# Patient Record
Sex: Male | Born: 1990 | Race: Black or African American | Hispanic: No | Marital: Single | Smoking: Former smoker
Health system: Southern US, Community
[De-identification: ages and names within clinical notes are randomized; demographics above are authoritative.]

## PROBLEM LIST (undated history)

## (undated) DIAGNOSIS — L309 Dermatitis, unspecified: Secondary | ICD-10-CM

## (undated) DIAGNOSIS — I10 Essential (primary) hypertension: Secondary | ICD-10-CM

## (undated) DIAGNOSIS — K649 Unspecified hemorrhoids: Secondary | ICD-10-CM

## (undated) HISTORY — DX: Dermatitis, unspecified: L30.9

## (undated) HISTORY — DX: Unspecified hemorrhoids: K64.9

## (undated) HISTORY — DX: Essential (primary) hypertension: I10

## (undated) HISTORY — PX: CYST EXCISION: SHX5701

## (undated) HISTORY — PX: VASECTOMY: SHX75

---
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
## 2009-08-02 ENCOUNTER — Emergency Department (HOSPITAL_COMMUNITY): Admission: EM | Admit: 2009-08-02 | Discharge: 2009-08-02 | Payer: Self-pay | Admitting: Emergency Medicine

## 2010-12-19 NOTE — Op Note (Signed)
NAME:  Andre Pierce, Andre Pierce                        ACCOUNT NO.:  [id number]   MEDICAL RECORD NO.:  [id number]                   PATIENT TYPE:  AMB   LOCATION:  DSC                                  FACILITY:  [facility]   PHYSICIAN:  Cindee Salt, M.D.                    DATE OF BIRTH:  Dec 20, 1990   DATE OF PROCEDURE:  08/02/2003  DATE OF DISCHARGE:                                 OPERATIVE REPORT   PREOPERATIVE DIAGNOSIS:  Volar wrist ganglion, right wrist.   POSTOPERATIVE DIAGNOSIS:  Volar wrist ganglion, right wrist.   OPERATION/PROCEDURE:  Excision volar wrist ganglion, right wrist.   SURGEON:  Cindee Salt, M.D.   Threasa HeadsCarolyne Fiscal.   ANESTHESIA:  General.   HISTORY:  The patient is a 20 year old male with a history of mass over the  radial aspect of his right wrist.  They are desirous of removal, being aware  of wrists and complications.   DESCRIPTION OF PROCEDURE:  The patient is brought to the operating room  where a general anesthetic was carried out without difficulty.  He was  prepped using DuraPrep, supine position, right arm free.  The limb was  exsanguinated with an Esmarch bandage.  Tourniquet high on the arm was  inflated to 250 mmHg.  A curvilinear incision was made over the mass,  carried down through subcutaneous tissue. Bleeders were  electrocauterized.  The radial artery was identified and protected and a large cystic,  multilobulated mass was immediately encountered.  With blunt and sharp  dissection, this was dissected free, protecting the radial artery.  The  specimen was followed down into the radiocarpal joint.  This area was opened  and debrided.  The specimen was sent to pathology.  The wound was irrigated.  The subcutaneous tissue was then closed with interrupted 4-0 Vicryl sutures.  The incision was injected with 0.25% Marcaine without epinephrine, 2 mL  used.  Skin was closed with subcuticular 4-0 Monocryl sutures.  Steri-Strips  are applied.  Sterile  compressive dressings and splint were applied.  The  patient tolerated the procedure well and was taken to the recovery room for  observation in satisfactory condition.   He is discharged home to return to the [facility] in one week.  Tylenol No. 3 and Keflex.                                               Cindee Salt, M.D.    Angelique Blonder  D:  08/02/2003  T:  08/02/2003  Job:  [id number]

## 2011-09-09 ENCOUNTER — Emergency Department (INDEPENDENT_AMBULATORY_CARE_PROVIDER_SITE_OTHER)
Admission: EM | Admit: 2011-09-09 | Discharge: 2011-09-09 | Disposition: A | Discharge status: Home / Self Care | Payer: Self-pay | Source: Home / Self Care | Attending: Emergency Medicine | Admitting: Emergency Medicine

## 2011-09-09 ENCOUNTER — Encounter (HOSPITAL_COMMUNITY): Payer: Self-pay

## 2011-09-09 DIAGNOSIS — K649 Unspecified hemorrhoids: Secondary | ICD-10-CM

## 2011-09-09 DIAGNOSIS — L309 Dermatitis, unspecified: Secondary | ICD-10-CM

## 2011-09-09 DIAGNOSIS — L259 Unspecified contact dermatitis, unspecified cause: Secondary | ICD-10-CM

## 2011-09-09 LAB — OCCULT BLOOD, POC DEVICE: Fecal Occult Bld: NEGATIVE

## 2011-09-09 MED ORDER — TRIAMCINOLONE ACETONIDE 0.1 % EX CREA: TOPICAL_CREAM | Freq: Two times a day (BID) | CUTANEOUS | Status: AC

## 2011-09-09 MED ORDER — HYDROCORTISONE ACETATE 25 MG RE SUPP: 25.0000 mg | Freq: Two times a day (BID) | RECTAL | Status: AC

## 2011-09-09 NOTE — ED Provider Notes (Signed)
History     CSN: [id number]  Arrival date & time 09/09/11  1526   First MD Initiated Contact with Patient 09/09/11 1658      Chief Complaint  Patient presents with  . Rash    (Consider location/radiation/quality/duration/timing/severity/associated sxs/prior treatment) HPI Comments: The patient presents tonight with 2 complaints: Anal pain and eczema.  He has had anal pain for about 2 days. He feels like something is protruding from the anus. He denies any blood in the stool or black stools and has had no diarrhea or constipation. He denies any abdominal pain or prior history of hemorrhoids.  He also has had a two-year history of an eczema-like rash on his arms and legs. He has never seen a doctor for this and has never tried any medication.  Patient is a 21 y.o. male presenting with rash.  Rash     History reviewed. No pertinent past medical history.  History reviewed. No pertinent past surgical history.  History reviewed. No pertinent family history.  History  Substance Use Topics  . Smoking status: Never Smoker   . Smokeless tobacco: Not on file  . Alcohol Use: No      Review of Systems  Constitutional: Negative for fever, chills, appetite change and unexpected weight change.  Respiratory: Negative for cough, shortness of breath and wheezing.   Cardiovascular: Negative for chest pain.  Gastrointestinal: Positive for rectal pain. Negative for nausea, vomiting, abdominal pain, diarrhea, constipation, blood in stool, abdominal distention and anal bleeding.  Genitourinary: Negative for dysuria, urgency and frequency.  Skin: Positive for rash. Negative for color change, pallor and wound.    Allergies  Review of patient's allergies indicates no known allergies.  Home Medications   Current Outpatient Rx  Name Route Sig Dispense Refill  . HYDROCORTISONE ACETATE 25 MG RE SUPP Rectal Place 1 suppository (25 mg total) rectally 2 (two) times daily. 12 suppository 3  .  TRIAMCINOLONE ACETONIDE 0.1 % EX CREA Topical Apply topically 2 (two) times daily. 2268 g 3    BP 110/50  Pulse 89  Temp(Src) 98.3 F (36.8 C) (Oral)  Resp 18  SpO2 98%  Physical Exam  Nursing note and vitals reviewed. Constitutional: He appears well-developed and well-nourished. No distress.  Eyes: No scleral icterus.  Cardiovascular: Normal rate, regular rhythm and normal heart sounds.  Exam reveals no gallop and no friction rub.   No murmur heard. Pulmonary/Chest: Effort normal and breath sounds normal. No respiratory distress. He has no wheezes. He has no rales.  Abdominal: Soft. Bowel sounds are normal. He exhibits no distension and no mass. There is no hepatosplenomegaly. There is no tenderness. There is no rebound, no guarding and no CVA tenderness.  Genitourinary: Guaiac negative stool.       Anoscopic exam a reveals normal external exam. Anoscope was inserted he has one MRI that was not bleeding or inflamed. Digital rectal exam reveals no masses no tenderness, normal prostate, and heme-negative stools.  Skin: Skin is warm and dry. No abrasion, no bruising, no ecchymosis, no lesion and no rash noted. He is not diaphoretic. No erythema. No pallor.       He has an eczema-like rash on his arms and legs with hyperpigmentation and lichenification.    ED Course  Procedures (including critical care time)   Labs Reviewed  OCCULT BLOOD, POC DEVICE   No results found.   1. Hemorrhoids   2. Eczema       MDM  His anal symptoms appear  to be due to a hemorrhoid and I gave him a prescription for Anusol HC. He was interested in having something more permanent done, so I gave him a referral to Dr. Avel Peace. I also discussed anal hygiene with him as well as dietary changes such as more fiber, fluids, and regular exercise.  For his eczema I gave him a prescription for Lantus alone cream and advised him in proper skin care.        Roque Lias, MD 09/09/11  [phone]

## 2011-09-09 NOTE — ED Notes (Signed)
C.o eczema is flaring up again and something on his but is giving him a problem, mostly when he has to go to the restroom ; unable to visualize anything at present

## 2012-03-17 ENCOUNTER — Encounter (HOSPITAL_COMMUNITY): Payer: Self-pay

## 2012-03-17 ENCOUNTER — Emergency Department (HOSPITAL_COMMUNITY)
Admission: EM | Admit: 2012-03-17 | Discharge: 2012-03-17 | Disposition: A | Discharge status: Home / Self Care | Payer: Self-pay | Attending: Emergency Medicine | Admitting: Emergency Medicine

## 2012-03-17 DIAGNOSIS — W268XXA Contact with other sharp object(s), not elsewhere classified, initial encounter: Secondary | ICD-10-CM | POA: Insufficient documentation

## 2012-03-17 DIAGNOSIS — W208XXA Other cause of strike by thrown, projected or falling object, initial encounter: Secondary | ICD-10-CM | POA: Insufficient documentation

## 2012-03-17 DIAGNOSIS — S0180XA Unspecified open wound of other part of head, initial encounter: Secondary | ICD-10-CM | POA: Insufficient documentation

## 2012-03-17 DIAGNOSIS — S41109A Unspecified open wound of unspecified upper arm, initial encounter: Secondary | ICD-10-CM | POA: Insufficient documentation

## 2012-03-17 DIAGNOSIS — S41119A Laceration without foreign body of unspecified upper arm, initial encounter: Secondary | ICD-10-CM

## 2012-03-17 DIAGNOSIS — S0181XA Laceration without foreign body of other part of head, initial encounter: Secondary | ICD-10-CM

## 2012-03-17 MED ORDER — TETANUS-DIPHTH-ACELL PERTUSSIS 5-2.5-18.5 LF-MCG/0.5 IM SUSP
0.5000 mL | Freq: Once | INTRAMUSCULAR | Status: AC
  Administered 2012-03-17: 0.5 mL via INTRAMUSCULAR
  Filled 2012-03-17: qty 0.5

## 2012-03-17 NOTE — ED Provider Notes (Signed)
History  Scribed for Raeford Razor, MD, the patient was seen in room TR10C/TR10C. This chart was scribed by Candelaria Stagers. The patient's care started at 1:15 PM   CSN: [id number]  Arrival date & time 03/17/12  1259   First MD Initiated Contact with Patient 03/17/12 1310      Chief Complaint  Patient presents with  . Laceration     The history is provided by the patient.   Andre Pierce. is a 21 y.o. male who presents to the Emergency Department complaining of a laceration to his right upper arm and left forehead after a light fixture fell onto pt last night.  He reports the metal part of the fixture cut his arm.  He denies LOC, back pain, neck pain, or headache.  Last tetanus shot unknown.  He is experiencing no other sx.   History reviewed. No pertinent past medical history.  History reviewed. No pertinent past surgical history.  No family history on file.  History  Substance Use Topics  . Smoking status: Never Smoker   . Smokeless tobacco: Not on file  . Alcohol Use: No      Review of Systems  Constitutional: Negative for fever.       10 Systems reviewed and are negative for acute change except as noted in the HPI.  HENT: Negative for congestion and neck pain.   Eyes: Negative for discharge and redness.  Respiratory: Negative for cough and shortness of breath.   Cardiovascular: Negative for chest pain.  Gastrointestinal: Negative for vomiting and abdominal pain.  Musculoskeletal: Negative for back pain.  Skin: Positive for wound (laceration to right upper arm, laceration to left forehead). Negative for rash.  Neurological: Negative for syncope, numbness and headaches.  Psychiatric/Behavioral:       No behavior change.    Allergies  Review of patient's allergies indicates no known allergies.  Home Medications   Current Outpatient Rx  Name Route Sig Dispense Refill  . TRIAMCINOLONE ACETONIDE 0.1 % EX CREA Topical Apply topically 2 (two) times daily. 2268  g 3    BP 118/75  Pulse 77  Temp 98.7 F (37.1 C) (Oral)  Resp 16  Ht 5\' 4"  (1.626 m)  Wt 137 lb (62.143 kg)  BMI 23.52 kg/m2  SpO2 98%  Physical Exam  Nursing note and vitals reviewed. Constitutional:       Awake, alert, nontoxic appearance.  HENT:  Head: Atraumatic.  Eyes: Right eye exhibits no discharge. Left eye exhibits no discharge.  Neck: Neck supple.  Pulmonary/Chest: Effort normal. He exhibits no tenderness.  Abdominal: Soft. There is no tenderness. There is no rebound.  Musculoskeletal: He exhibits no tenderness.       .5 cm laceration to the central aspect of the head.  3.5 cm laceration to the posterior upper right arm.  No active bleeding, fat exposed.  Neurovascularly intact.    Neurological:       Mental status and motor strength appears baseline for patient and situation.  Skin: No rash noted.  Psychiatric: He has a normal mood and affect.    ED Course  Procedures   DIAGNOSTIC STUDIES: Oxygen Saturation is 98% on room air, normal by my interpretation.    COORDINATION OF CARE:  1:20PM  LACERATION REPAIR Performed by: Raeford Razor, MD Consent: Verbal consent obtained. Risks and benefits: risks, benefits and alternatives were discussed Patient identity confirmed: provided demographic data Time out performed prior to procedure Prepped and Draped in normal sterile fashion  Wound explored Laceration Location: central aspect of the head Laceration Length: 0.5cm No Foreign Bodies seen or palpated Anesthesia: local infiltration Local anesthetic: lidocaine 2% w epinephrine Anesthetic total: 0.5 cc Irrigation method: syringe Amount of cleaning: standard Skin closure: simple interrupted  Number of sutures or staples: 1 suture w/6-0 prolene  Patient tolerance: Patient tolerated the procedure well with no immediate complications.  LACERATION REPAIR Performed by: Raeford Razor, MD Consent: Verbal consent obtained. Risks and benefits: risks, benefits and  alternatives were discussed Patient identity confirmed: provided demographic data Time out performed prior to procedure Prepped and Draped in normal sterile fashion Wound explored Laceration Location: posterior right upper arm  Laceration Length: 3.5cm No Foreign Bodies seen or palpated Anesthesia: local infiltration Local anesthetic: lidocaine 2% w epinephrine Anesthetic total: 3 cc Irrigation method: syringe Amount of cleaning: standard Skin closure: horizontal mattress sutures Number of sutures or staples: 4 sutures w/4-0 prolene  Technique: Single layer  Patient tolerance: Patient tolerated the procedure well with no immediate complications.  Labs Reviewed - No data to display No results found.   1. Forehead laceration   2. Arm laceration       MDM  21 year old male with lacerations to his forehead and right upper chimney. He should thoroughly irrigated. No evidence of foreign body throughout range of motion or evidence of involvement of deeper structures. Neurovascularly intact. Plan continued wound care. Tetanus is updated. Return precautions were discussed. Outpatient followup for suture removal.  I personally preformed the services scribed in my presence. The recorded information has been reviewed and considered. Raeford Razor, MD.        Raeford Razor, MD 03/25/12 [phone]

## 2012-03-17 NOTE — ED Notes (Signed)
Pt presents with laceration to R upper arm and puncture wound to forehead after light fixture, fell onto pt.  -LOC;  Tetanus status : unknown

## 2012-04-07 ENCOUNTER — Emergency Department (INDEPENDENT_AMBULATORY_CARE_PROVIDER_SITE_OTHER)
Admission: EM | Admit: 2012-04-07 | Discharge: 2012-04-07 | Discharge status: Against Medical Advice | Payer: Self-pay | Source: Home / Self Care | Attending: Emergency Medicine | Admitting: Emergency Medicine

## 2012-04-07 ENCOUNTER — Encounter (HOSPITAL_COMMUNITY): Payer: Self-pay | Admitting: *Deleted

## 2012-04-07 DIAGNOSIS — Z5321 Procedure and treatment not carried out due to patient leaving prior to being seen by health care provider: Secondary | ICD-10-CM

## 2012-04-07 NOTE — ED Notes (Signed)
Suture removal of sutures that have been in place for over a month.

## 2012-04-07 NOTE — ED Notes (Signed)
One suture removed from forehead - no s/s of infection 4 sutures removed from right forearm - area scabbed over, no s/s of infection - cleansed with alcohol

## 2012-04-07 NOTE — ED Provider Notes (Signed)
History     CSN: [id number]  Arrival date & time 04/07/12  1520   First MD Initiated Contact with Patient 04/07/12 1526      Chief Complaint  Patient presents with  . Suture / Staple Removal    (Consider location/radiation/quality/duration/timing/severity/associated sxs/prior treatment) HPI Comments: Patient had one staple on the forehead, 4 4-0 Prolene sutures placed in the posterior right arm on 8/15.   History reviewed. No pertinent past medical history.  History reviewed. No pertinent past surgical history.  Family History  Problem Relation Age of Onset  . Family history unknown: Yes    History  Substance Use Topics  . Smoking status: Current Everyday Smoker -- 0.5 packs/day  . Smokeless tobacco: Not on file  . Alcohol Use: No      Review of Systems  Allergies  Review of patient's allergies indicates no known allergies.  Home Medications   Current Outpatient Rx  Name Route Sig Dispense Refill  . TRIAMCINOLONE ACETONIDE 0.1 % EX CREA Topical Apply topically 2 (two) times daily. 2268 g 3    BP 121/82  Pulse 80  Temp 98.2 F (36.8 C) (Oral)  Resp 18  SpO2 99%  Physical Exam  ED Course  Procedures (including critical care time)  Labs Reviewed - No data to display No results found.   1. Patient left without being seen       MDM  Previous records reviewed. As noted in history of present illness.   RN removed forehead staple, sutures. Patient left before evaluated by MD.  I did not participate in the care of this patient.   Luiz Blare, MD 04/07/12 [phone]

## 2012-06-05 ENCOUNTER — Encounter (HOSPITAL_COMMUNITY): Payer: Self-pay | Admitting: *Deleted

## 2012-06-05 ENCOUNTER — Emergency Department (HOSPITAL_COMMUNITY)
Admission: EM | Admit: 2012-06-05 | Discharge: 2012-06-05 | Disposition: A | Discharge status: Home / Self Care | Payer: Self-pay | Source: Home / Self Care | Attending: Emergency Medicine | Admitting: Emergency Medicine

## 2012-06-05 DIAGNOSIS — A599 Trichomoniasis, unspecified: Secondary | ICD-10-CM

## 2012-06-05 MED ORDER — METRONIDAZOLE 500 MG PO TABS: 500.0000 mg | ORAL_TABLET | Freq: Two times a day (BID) | ORAL | Status: AC

## 2012-06-05 NOTE — ED Provider Notes (Signed)
History     CSN: [id number]  Arrival date & time 06/05/12  1237   First MD Initiated Contact with Patient 06/05/12 1238      Chief Complaint  Patient presents with  . Exposure to STD    (Consider location/radiation/quality/duration/timing/severity/associated sxs/prior treatment) HPI Comments: Patient presents urgent care requesting to be treated for trichomoniasis as his sexual partner was diagnosed with trichomoniasis recently. He denies any symptoms such as penile discharge, dysuria, genital sores or rashes.  Patient is a 21 y.o. male presenting with STD exposure. The history is provided by the patient.  Exposure to STD This is a new problem. Nothing aggravates the symptoms.    History reviewed. No pertinent past medical history.  History reviewed. No pertinent past surgical history.  No family history on file.  History  Substance Use Topics  . Smoking status: Current Every Day Smoker -- 0.5 packs/day  . Smokeless tobacco: Not on file  . Alcohol Use: No      Review of Systems  Constitutional: Negative for activity change and appetite change.  Genitourinary: Negative for dysuria, urgency, frequency, flank pain, decreased urine volume, discharge, penile swelling, genital sores, penile pain and testicular pain.    Allergies  Review of patient's allergies indicates no known allergies.  Home Medications   Current Outpatient Rx  Name  Route  Sig  Dispense  Refill  . METRONIDAZOLE 500 MG PO TABS   Oral   Take 1 tablet (500 mg total) by mouth 2 (two) times daily.   14 tablet   0   . TRIAMCINOLONE ACETONIDE 0.1 % EX CREA   Topical   Apply topically 2 (two) times daily.   2268 g   3     BP 130/86  Pulse 79  Temp 98.8 F (37.1 C) (Oral)  Resp 17  SpO2 100%  Physical Exam  Nursing note and vitals reviewed. Constitutional: Vital signs are normal. He appears well-developed and well-nourished.  Non-toxic appearance. He does not have a sickly appearance. He  does not appear ill. No distress.  Genitourinary: Penis normal. Guaiac negative stool. No penile tenderness.  Skin: No erythema.    ED Course  Procedures (including critical care time)   Labs Reviewed  GC/CHLAMYDIA PROBE AMP, GENITAL   No results found.   1. Trichomonal infection       MDM   Sexual partner diagnosed with trichomoniasis. Patient has been provided with prescription of Flagyl and a DNA urethral probe was obtained for further STD screening as in Chlamydia and gonorrhea. Patient agrees with treatment plan and will be contacted if abnormal test results will require further treatment.       Jimmie Molly, MD 06/05/12 [phone]

## 2012-06-05 NOTE — ED Notes (Signed)
Pt reports  He  Was  Exposed  To  Trich      His   Sexual  Partner   Reportedly  Had  Trich  -  Pt  denys  Any  Symptoms

## 2012-06-06 NOTE — ED Notes (Signed)
Final report pending

## 2012-06-07 LAB — GC/CHLAMYDIA PROBE AMP, GENITAL
Chlamydia, DNA Probe: POSITIVE — AB
GC Probe Amp, Genital: NEGATIVE

## 2012-06-08 ENCOUNTER — Telehealth (HOSPITAL_COMMUNITY): Payer: Self-pay | Admitting: *Deleted

## 2012-06-08 MED ORDER — AZITHROMYCIN 250 MG PO TABS: ORAL_TABLET | ORAL | Status: DC

## 2012-06-08 NOTE — ED Notes (Signed)
Call from pharmacy, asking for clarification on e-Rx for pt ; spoke w Langston Masker, and pt is to receive zithromax 250 mg x4 pills in 1 dose. Spoke directly w pharmacist

## 2012-06-08 NOTE — ED Notes (Signed)
GC neg., Chlamydia pos.  Pt. Needs treatment.  Labs shown to [employer] PA and she e-prescribed Zithromax 1 gm po x 1 dose to the [facility] on [address]. I called pt.  Pt. verified x 2 and given results.  Pt. told he needs the Zithromax, where to get it and to finish the Flagyl. Pt.'s questions answered.  Pt. instructed to notify his partner, no sex for 1 week and to practice safe sex. Pt. told he can get HIV testing at the [facility]. STD clinic, by appointment.  Pt. voiced understanding.  [facility] form completed and faxed to the [facility]. Vassie Moselle 06/08/2012

## 2012-09-24 ENCOUNTER — Encounter (HOSPITAL_COMMUNITY): Payer: Self-pay | Admitting: *Deleted

## 2012-09-24 ENCOUNTER — Emergency Department (INDEPENDENT_AMBULATORY_CARE_PROVIDER_SITE_OTHER)
Admission: EM | Admit: 2012-09-24 | Discharge: 2012-09-24 | Disposition: A | Discharge status: Home / Self Care | Payer: Self-pay | Source: Home / Self Care

## 2012-09-24 ENCOUNTER — Other Ambulatory Visit (HOSPITAL_COMMUNITY)
Admission: RE | Admit: 2012-09-24 | Discharge: 2012-09-24 | Disposition: A | Discharge status: Home / Self Care | Payer: Self-pay | Source: Ambulatory Visit | Attending: Family Medicine | Admitting: Family Medicine

## 2012-09-24 DIAGNOSIS — M7918 Myalgia, other site: Secondary | ICD-10-CM

## 2012-09-24 DIAGNOSIS — M545 Low back pain, unspecified: Secondary | ICD-10-CM

## 2012-09-24 DIAGNOSIS — R319 Hematuria, unspecified: Secondary | ICD-10-CM

## 2012-09-24 DIAGNOSIS — A749 Chlamydial infection, unspecified: Secondary | ICD-10-CM

## 2012-09-24 DIAGNOSIS — M549 Dorsalgia, unspecified: Secondary | ICD-10-CM

## 2012-09-24 DIAGNOSIS — Z113 Encounter for screening for infections with a predominantly sexual mode of transmission: Secondary | ICD-10-CM | POA: Insufficient documentation

## 2012-09-24 DIAGNOSIS — N390 Urinary tract infection, site not specified: Secondary | ICD-10-CM

## 2012-09-24 LAB — POCT URINALYSIS DIP (DEVICE)
Glucose, UA: NEGATIVE mg/dL
Nitrite: NEGATIVE
Protein, ur: 30 mg/dL — AB
Specific Gravity, Urine: 1.02 (ref 1.005–1.030)
Urobilinogen, UA: >=8 mg/dL (ref 0.0–1.0)
pH: 6 (ref 5.0–8.0)

## 2012-09-24 MED ORDER — CEPHALEXIN 500 MG PO CAPS: 500.0000 mg | ORAL_CAPSULE | Freq: Four times a day (QID) | ORAL | Status: DC

## 2012-09-24 MED ORDER — TRAMADOL HCL 50 MG PO TABS: 50.0000 mg | ORAL_TABLET | Freq: Four times a day (QID) | ORAL | Status: DC | PRN

## 2012-09-24 MED ORDER — KETOROLAC TROMETHAMINE 10 MG PO TABS: 10.0000 mg | ORAL_TABLET | Freq: Four times a day (QID) | ORAL | Status: DC | PRN

## 2012-09-24 MED ORDER — AZITHROMYCIN 250 MG PO TABS: 250.0000 mg | ORAL_TABLET | Freq: Once | ORAL | Status: DC

## 2012-09-24 NOTE — ED Provider Notes (Signed)
History     CSN: [id number]  Arrival date & time 09/24/12  1142   First MD Initiated Contact with Patient 09/24/12 1212      Chief Complaint  Patient presents with  . 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 It is located in the right paralumbar musculature and the right posterior costal margin. Palpation of this area creates tenderness. It is worse with movement, bending, stooping, lifting, or rotation of the torso and lying on the right side. The patient was here several days ago and diagnosed with Chlamydia. A prescription was called into the drugstore and the patient notified. He fell to pick up the medication and has not been treated. A urinalysis was obtained which shows hematuria and small amount of WBCs. He states sometimes at night he wakes up sweating and feels hot. Denies cough, shortness of breath, wheezing or orthopnea.    History reviewed. No pertinent past medical history.  History reviewed. No pertinent past surgical history.  No family history on file.  History  Substance Use Topics  . Smoking status: Current Every Day Smoker -- 0.50 packs/day  . Smokeless tobacco: Not on file  . Alcohol Use: No      Review of Systems  Constitutional: Positive for fever, diaphoresis and activity change.  HENT: Negative.   Cardiovascular: Negative.   Gastrointestinal: Positive for nausea. Negative for vomiting and abdominal pain.  Musculoskeletal: Positive for myalgias and back pain.  Skin: Negative.   Neurological: Negative.   Psychiatric/Behavioral: Negative.     Allergies  Review of patient's allergies indicates no known allergies.  Home Medications   Current Outpatient Rx  Name  Route  Sig  Dispense  Refill  . azithromycin (ZITHROMAX Z-PAK) 250 MG tablet      4  tablets po stat.   6 each   0   . cephALEXin (KEFLEX) 500 MG capsule   Oral   Take 1 capsule (500 mg total) by mouth 4 (four) times daily.   28 capsule   0   . ketorolac (TORADOL) 10 MG tablet   Oral   Take 1 tablet (10 mg total) by mouth 4 (four) times daily as needed for pain.   15 tablet   0   . traMADol (ULTRAM) 50 MG tablet   Oral   Take 1 tablet (50 mg total) by mouth every 6 (six) hours as needed for pain.   15 tablet   0     BP 130/80  Pulse 72  Temp(Src) 99.6 F (37.6 C) (Oral)  SpO2 100%  Physical Exam  Constitutional: He is oriented to person, place, and time. He appears well-developed. No distress.  Eyes: Conjunctivae and EOM are normal.  Neck: Neck supple.  Cardiovascular: Normal rate and normal heart sounds.   Pulmonary/Chest: Effort normal and breath sounds normal. No respiratory distress. He has no wheezes. He has no rales.  Abdominal: Soft. He exhibits no distension.  Musculoskeletal: Normal range of motion. He exhibits tenderness. He exhibits no edema.  Tenderness in the right low back as described above. Pain elicited with leaning forward and to the left. Standing and turning of the torso to the left also produces pain.  Lymphadenopathy:    He has no cervical adenopathy.  Neurological: He is alert and oriented to person, place, and time.  Skin: Skin is warm and dry.  Psychiatric: He  has a normal mood and affect.    ED Course  Procedures (including critical care time)  Labs Reviewed  POCT URINALYSIS DIP (DEVICE) - Abnormal; Notable for the following:    Bilirubin Urine MODERATE (*)    Ketones, ur TRACE (*)    Hgb urine dipstick MODERATE (*)    Protein, ur 30 (*)    Leukocytes, UA TRACE (*)    All other components within normal limits  URINE CULTURE  URINE CYTOLOGY ANCILLARY ONLY   No results found.   1. Lumbar muscle pain   2. Back pain   3. Chlamydia infection   4. UTI (lower urinary tract infection)   5. Hematuria       MDM   By the patient's history the right lower back paralumbar musculature is mildly tender but exacerbated by lying on the right side bending over into the left and other movements. He also has a mild elevation in temperature. Turns out he did not take his azithromycin when he was here before to treat his Chlamydia. He also has a urinary tract infection. Hematuria may be coming from the UTI or possibly a ureteral calculus. He is having mild intermittent pain male. We will treat with Toradol 10 mg one every 4 hours as needed for pain. Tramadol 1 tablet every 4 hours when necessary pain #15  Keflex 500 mg 4 times a day for 7 days He must take your azithromycin to treat the Chlamydia. Tylenol every 4 hours for any fever or discomfort The pain in her back it is much worse or severe he may need to go to the emergency department for further workup. Is also imperative that you obtain a primary care doctor for health maintenance and followup on medical issues.         Hayden Rasmussen, NP 09/24/12 1406

## 2012-09-24 NOTE — ED Notes (Signed)
Pt  Reports  Low  Back   Pain  For  sev  Days         denys  Any  specefic  Injury                He  Was  Seen   About  1  Month  Ago  htested  Pos  For  chlymydia    Pt  states  He  Never took his  Azithromycin  That was  Phoned  To  The  Pharmacy

## 2012-09-24 NOTE — ED Provider Notes (Signed)
Medical screening examination/treatment/procedure(s) were performed by resident physician or non-physician practitioner and as supervising physician I was immediately available for consultation/collaboration.   Niaomi Cartaya DOUGLAS MD.   Rachna Schonberger D Olga Bourbeau, MD 09/24/12 1809 

## 2012-09-25 LAB — URINE CULTURE
Colony Count: NO GROWTH
Culture: NO GROWTH
Special Requests: NORMAL

## 2012-09-29 ENCOUNTER — Telehealth (HOSPITAL_COMMUNITY): Payer: Self-pay | Admitting: *Deleted

## 2012-09-29 NOTE — ED Notes (Signed)
GC/ neg., Chlamydia pos., Trich neg.  Pt. adequately treated with Zithromax. I called pt.  Pt. verified x 2 and given results. Pt. told he was adeq. treated.  Pt. instructed to notify his partner, no sex for 1 week and to practice safe sex. Pt. told he can get HIV testing at the [facility]. STD clinic, by appointment.  Pt. voiced understanding. Vassie Moselle 09/29/2012

## 2014-08-22 ENCOUNTER — Ambulatory Visit: Payer: Self-pay

## 2015-03-29 ENCOUNTER — Ambulatory Visit: Payer: Self-pay

## 2017-02-20 ENCOUNTER — Encounter (HOSPITAL_COMMUNITY): Payer: Self-pay | Admitting: Family Medicine

## 2017-02-20 ENCOUNTER — Ambulatory Visit (HOSPITAL_COMMUNITY)
Admission: EM | Admit: 2017-02-20 | Discharge: 2017-02-20 | Disposition: A | Discharge status: Home / Self Care | Payer: Self-pay | Attending: Family Medicine | Admitting: Family Medicine

## 2017-02-20 DIAGNOSIS — B356 Tinea cruris: Secondary | ICD-10-CM

## 2017-02-20 MED ORDER — KETOCONAZOLE 2 % EX CREA: TOPICAL_CREAM | CUTANEOUS | 0 refills | Status: DC

## 2017-02-20 NOTE — Discharge Instructions (Signed)
Clotrimazole 1% cream twice daily is an alternative to the ketoconazole.   Use baby powder to keep things dry after this issue resolves.

## 2017-02-20 NOTE — ED Provider Notes (Signed)
  [facility]    CSN: [id number] Arrival date & time: 02/20/17  1203     History   Chief Complaint Chief Complaint  Patient presents with  . itching in groin area    HPI Antony BlackbirdReginald S Lebleu Jr. is a 26 y.o. male.   HPI  Patient presents with 2 weeks of very itchy rash in his groin region. 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 He is very physically active at his job and has been sweating much more often given the hot weather. He has never had anything like this before. He denies any sick contacts, testicular pain, penile discharge, or new soaps/lotions/topical/detergents.   History reviewed. No pertinent past medical history.   History reviewed. No pertinent surgical history.     Home Medications    Takes no medications routinely.  Family History History reviewed. No pertinent family history.  Social History Social History  Substance Use Topics  . Smoking status: Current Every Day Smoker    Packs/day: 0.50  . Alcohol use No     Allergies   Patient has no known allergies.   Review of Systems Review of Systems  Skin:       +itchy rash in groin     Physical Exam Triage Vital Signs ED Triage Vitals [02/20/17 1224]  Enc Vitals Group     BP 128/85     Pulse Rate 88     Resp 18     Temp 98.3 F (36.8 C)     SpO2 100 %   Updated Vital Signs BP 128/85   Pulse 88   Temp 98.3 F (36.8 C)   Resp 18   SpO2 100%   Physical Exam  Constitutional: He is oriented to person, place, and time. He appears well-developed and well-nourished.  HENT:  Head: Normocephalic and atraumatic.  Genitourinary: Penis normal.  Neurological: He is alert and oriented to person, place, and time.  Skin:  Some scaling and hyperpigmentation/erythema in the groin region affecting scrotum and prox ant thigh, worse on the L, no drainage, fluctuance or TTP  Psychiatric: He has a normal mood and affect. Judgment normal.     UC Treatments / Results    Procedures Procedures none  Initial Impression / Assessment and Plan / UC Course  I have reviewed the triage vital signs and the nursing notes.  Pertinent labs & imaging results that were available during my care of the patient were reviewed by me and considered in my medical decision making (see chart for details).     Patient presents with tinea cruris. Ketoconazole to be called in, clotrimazole to be but over-the-counter if it is too expensive as he does not have insurance. Discussed trying to keep the area as dry as possible to prevent recurrence. Recommended baby powder once this issue is gone. The patient was recommended to get a primary care provider. He is discharged in stable condition. The patient voiced understanding and agreement to the plan.  Final Clinical Impressions(s) / UC Diagnoses   Final diagnoses:  Tinea cruris    New Prescriptions Discharge Medication List as of 02/20/2017 12:42 PM    START taking these medications   Details  ketoconazole (NIZORAL) 2 % cream Apply once daily to area in groin., Normal         Sharlene DoryWendling, Jager Koska Paul, DO 02/20/17 1247

## 2017-02-20 NOTE — ED Triage Notes (Signed)
Pt here with complaints of itching in the groin area. sts that he sweats a lot at work.

## 2020-04-16 ENCOUNTER — Ambulatory Visit (HOSPITAL_COMMUNITY)
Admission: EM | Admit: 2020-04-16 | Discharge: 2020-04-16 | Disposition: A | Discharge status: Home / Self Care | Payer: BC Managed Care – PPO | Attending: Family Medicine | Admitting: Family Medicine

## 2020-04-16 ENCOUNTER — Ambulatory Visit (INDEPENDENT_AMBULATORY_CARE_PROVIDER_SITE_OTHER): Payer: BC Managed Care – PPO

## 2020-04-16 ENCOUNTER — Other Ambulatory Visit: Payer: Self-pay

## 2020-04-16 ENCOUNTER — Encounter (HOSPITAL_COMMUNITY): Payer: Self-pay | Admitting: Emergency Medicine

## 2020-04-16 DIAGNOSIS — S20212A Contusion of left front wall of thorax, initial encounter: Secondary | ICD-10-CM | POA: Diagnosis not present

## 2020-04-16 DIAGNOSIS — R0781 Pleurodynia: Secondary | ICD-10-CM | POA: Diagnosis not present
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
## 2020-04-21 ENCOUNTER — Other Ambulatory Visit: Payer: Self-pay

## 2020-04-21 ENCOUNTER — Encounter (HOSPITAL_COMMUNITY): Payer: Self-pay

## 2020-04-21 ENCOUNTER — Ambulatory Visit (HOSPITAL_COMMUNITY)
Admission: EM | Admit: 2020-04-21 | Discharge: 2020-04-21 | Disposition: A | Discharge status: Home / Self Care | Payer: BC Managed Care – PPO | Attending: Emergency Medicine | Admitting: Emergency Medicine

## 2020-04-21 DIAGNOSIS — S20219D Contusion of unspecified front wall of thorax, subsequent encounter: Secondary | ICD-10-CM | POA: Diagnosis not present

## 2020-04-21 MED ORDER — HYDROCODONE-ACETAMINOPHEN 5-325 MG PO TABS: 1.0000 | ORAL_TABLET | Freq: Four times a day (QID) | ORAL | 0 refills | Status: DC | PRN

## 2020-04-21 MED ORDER — NAPROXEN 500 MG PO TABS: 500.0000 mg | ORAL_TABLET | Freq: Two times a day (BID) | ORAL | 0 refills | Status: DC

## 2020-04-21 MED ORDER — TIZANIDINE HCL 4 MG PO TABS: 4.0000 mg | ORAL_TABLET | Freq: Four times a day (QID) | ORAL | 0 refills | Status: DC | PRN

## 2020-04-21 NOTE — ED Triage Notes (Signed)
Pt is f/u from 04/16/20; states he feels rib pain is getting worse.
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
## 2020-04-21 NOTE — Discharge Instructions (Signed)
Naprosyn twice daily with food to help with swelling and inflammation Supplement with muscle relaxer/tizanidine at home Hydrocodone for severe pain Alternate ice and heat gentle stretching and movement, avoid significant lifting Follow-up if not improving or worsening

## 2020-05-22 DIAGNOSIS — K6289 Other specified diseases of anus and rectum: Secondary | ICD-10-CM | POA: Diagnosis not present

## 2020-07-15 DIAGNOSIS — Z1159 Encounter for screening for other viral diseases: Secondary | ICD-10-CM | POA: Diagnosis not present

## 2020-07-31 DIAGNOSIS — Z1159 Encounter for screening for other viral diseases: Secondary | ICD-10-CM | POA: Diagnosis not present

## 2020-09-02 DIAGNOSIS — Z01812 Encounter for preprocedural laboratory examination: Secondary | ICD-10-CM | POA: Diagnosis not present

## 2020-09-05 ENCOUNTER — Encounter (HOSPITAL_COMMUNITY): Payer: Self-pay

## 2020-09-05 ENCOUNTER — Other Ambulatory Visit: Payer: Self-pay

## 2020-09-05 ENCOUNTER — Ambulatory Visit (HOSPITAL_COMMUNITY)
Admission: EM | Admit: 2020-09-05 | Discharge: 2020-09-05 | Disposition: A | Discharge status: Home / Self Care | Payer: BLUE CROSS/BLUE SHIELD | Attending: Family Medicine | Admitting: Family Medicine

## 2020-09-05 DIAGNOSIS — Z202 Contact with and (suspected) exposure to infections with a predominantly sexual mode of transmission: Secondary | ICD-10-CM

## 2020-09-05 DIAGNOSIS — K6289 Other specified diseases of anus and rectum: Secondary | ICD-10-CM | POA: Diagnosis not present

## 2020-09-05 MED ORDER — METRONIDAZOLE 500 MG PO TABS: 2000.0000 mg | ORAL_TABLET | Freq: Once | ORAL | 0 refills | Status: AC

## 2020-09-05 NOTE — ED Provider Notes (Signed)
[facility]    CSN: [id number] Arrival date & time: 09/05/20  0803      History   Chief Complaint Chief Complaint  Patient presents with  . SEXUALLY TRANSMITTED DISEASE    HPI Andre Pierce. is a 30 y.o. male.   Here today requesting treatment for trichomoniasis as his girlfriend tested positive for this. They have had unprotected intercourse. He denies dysuria, hematuria, penile discharge, pelvic pain, fever.      History reviewed. No pertinent past medical history.  There are no problems to display for this patient.   History reviewed. No pertinent surgical history.     Home Medications    Prior to Admission medications   Medication Sig Start Date End Date Taking? Authorizing Provider  metroNIDAZOLE (FLAGYL) 500 MG tablet Take 4 tablets (2,000 mg total) by mouth once for 1 dose. 09/05/20 09/05/20 Yes Particia Nearing, PA-C  HYDROcodone-acetaminophen (NORCO/VICODIN) 5-325 MG tablet Take 1-2 tablets by mouth every 6 (six) hours as needed. 04/21/20   Wieters, Hallie C, PA-C  ketoconazole (NIZORAL) 2 % cream Apply once daily to area in groin. 02/20/17   Sharlene Dory, DO  naproxen (NAPROSYN) 500 MG tablet Take 1 tablet (500 mg total) by mouth 2 (two) times daily. 04/21/20   Wieters, Hallie C, PA-C  tiZANidine (ZANAFLEX) 4 MG tablet Take 1 tablet (4 mg total) by mouth every 6 (six) hours as needed for muscle spasms. 04/21/20   Wieters, Junius Creamer, PA-C    Family History Family History  Problem Relation Age of Onset  . Healthy Mother   . Healthy Father     Social History Social History   Tobacco Use  . Smoking status: Current Every Day Smoker    Packs/day: 0.50  . Smokeless tobacco: Never Used  Substance Use Topics  . Alcohol use: No  . Drug use: No     Allergies   Patient has no known allergies.   Review of Systems Review of Systems PER HPI   Physical Exam Triage Vital Signs ED Triage Vitals  Enc Vitals Group     BP  09/05/20 0822 139/89     Pulse Rate 09/05/20 0822 93     Resp 09/05/20 0822 16     Temp 09/05/20 0822 98.4 F (36.9 C)     Temp Source 09/05/20 0822 Oral     SpO2 09/05/20 0822 99 %     Weight --      Height --      Head Circumference --      Peak Flow --      Pain Score 09/05/20 0819 0     Pain Loc --      Pain Edu? --      Excl. in GC? --    No data found.  Updated Vital Signs BP 139/89 (BP Location: Right Arm)   Pulse 93   Temp 98.4 F (36.9 C) (Oral)   Resp 16   SpO2 99%   Visual Acuity Right Eye Distance:   Left Eye Distance:   Bilateral Distance:    Right Eye Near:   Left Eye Near:    Bilateral Near:     Physical Exam Vitals and nursing note reviewed.  Constitutional:      Appearance: Normal appearance.  HENT:     Head: Atraumatic.  Eyes:     Extraocular Movements: Extraocular movements intact.     Conjunctiva/sclera: Conjunctivae normal.  Cardiovascular:     Rate and  Rhythm: Normal rate and regular rhythm.  Pulmonary:     Effort: Pulmonary effort is normal.     Breath sounds: Normal breath sounds.  Abdominal:     General: Bowel sounds are normal. There is no distension.     Palpations: Abdomen is soft.     Tenderness: There is no abdominal tenderness. There is no right CVA tenderness, left CVA tenderness or guarding.  Genitourinary:    Comments: GU exam deferred, self swab performed Musculoskeletal:        General: Normal range of motion.     Cervical back: Normal range of motion and neck supple.  Skin:    General: Skin is warm and dry.  Neurological:     General: No focal deficit present.     Mental Status: He is oriented to person, place, and time.  Psychiatric:        Mood and Affect: Mood normal.        Thought Content: Thought content normal.        Judgment: Judgment normal.      UC Treatments / Results  Labs (all labs ordered are listed, but only abnormal results are displayed) Labs Reviewed  CYTOLOGY, (ORAL, ANAL, URETHRAL)  ANCILLARY ONLY    EKG   Radiology No results found.  Procedures Procedures (including critical care time)  Medications Ordered in UC Medications - No data to display  Initial Impression / Assessment and Plan / UC Course  I have reviewed the triage vital signs and the nursing notes.  Pertinent labs & imaging results that were available during my care of the patient were reviewed by me and considered in my medical decision making (see chart for details).     Flagyl sent given known exposure to trichomonas, cytology swab pending. Discussed abstinence 1 week post treatment. Safe sexual practices reviewed.   Final Clinical Impressions(s) / UC Diagnoses   Final diagnoses:  Exposure to trichomonas   Discharge Instructions   None    ED Prescriptions    Medication Sig Dispense Auth. Provider   metroNIDAZOLE (FLAGYL) 500 MG tablet Take 4 tablets (2,000 mg total) by mouth once for 1 dose. 4 tablet Particia Nearing, [place]     PDMP not reviewed this encounter.   Particia Nearing, [place] 09/05/20 1258

## 2020-09-05 NOTE — ED Triage Notes (Signed)
Patient presents to Urgent Care with complaints of that GF was dx with trich x 2 weeks ago. Pt declines testing at this time. He states he is here for treatment.    Denies any symptoms.

## 2020-09-06 LAB — CYTOLOGY, (ORAL, ANAL, URETHRAL) ANCILLARY ONLY
Chlamydia: NEGATIVE
Comment: NEGATIVE
Comment: NEGATIVE
Comment: NORMAL
Neisseria Gonorrhea: NEGATIVE
Trichomonas: NEGATIVE

## 2020-09-23 ENCOUNTER — Other Ambulatory Visit: Payer: Self-pay | Admitting: Gastroenterology

## 2020-09-23 DIAGNOSIS — K6289 Other specified diseases of anus and rectum: Secondary | ICD-10-CM

## 2020-09-29 ENCOUNTER — Inpatient Hospital Stay: Admission: RE | Admit: 2020-09-29 | Payer: BLUE CROSS/BLUE SHIELD | Source: Ambulatory Visit

## 2020-10-20 ENCOUNTER — Inpatient Hospital Stay: Admission: RE | Admit: 2020-10-20 | Payer: BLUE CROSS/BLUE SHIELD | Source: Ambulatory Visit

## 2020-11-24 ENCOUNTER — Ambulatory Visit
Admission: RE | Admit: 2020-11-24 | Discharge: 2020-11-24 | Disposition: A | Discharge status: Home / Self Care | Payer: BLUE CROSS/BLUE SHIELD | Source: Ambulatory Visit | Attending: Gastroenterology | Admitting: Gastroenterology

## 2020-11-24 ENCOUNTER — Other Ambulatory Visit: Payer: Self-pay | Admitting: Gastroenterology

## 2020-11-24 ENCOUNTER — Other Ambulatory Visit: Payer: Self-pay

## 2020-11-24 DIAGNOSIS — K6289 Other specified diseases of anus and rectum: Secondary | ICD-10-CM | POA: Diagnosis not present

## 2020-11-24 MED ORDER — GADOBENATE DIMEGLUMINE 529 MG/ML IV SOLN
1.0000 mL | Freq: Once | INTRAVENOUS | Status: AC | PRN
  Administered 2020-11-24: 1 mL via INTRAVENOUS

## 2020-11-25 ENCOUNTER — Other Ambulatory Visit: Payer: Self-pay | Admitting: Gastroenterology

## 2020-11-25 DIAGNOSIS — K6289 Other specified diseases of anus and rectum: Secondary | ICD-10-CM

## 2020-12-01 ENCOUNTER — Ambulatory Visit
Admission: RE | Admit: 2020-12-01 | Discharge: 2020-12-01 | Disposition: A | Discharge status: Home / Self Care | Payer: BLUE CROSS/BLUE SHIELD | Source: Ambulatory Visit | Attending: Gastroenterology | Admitting: Gastroenterology

## 2020-12-01 ENCOUNTER — Other Ambulatory Visit: Payer: Self-pay

## 2020-12-01 DIAGNOSIS — K6289 Other specified diseases of anus and rectum: Secondary | ICD-10-CM | POA: Diagnosis not present

## 2020-12-01 MED ORDER — GADOBENATE DIMEGLUMINE 529 MG/ML IV SOLN
12.0000 mL | Freq: Once | INTRAVENOUS | Status: AC | PRN
  Administered 2020-12-01: 12 mL via INTRAVENOUS

## 2021-05-22 DIAGNOSIS — Z23 Encounter for immunization: Secondary | ICD-10-CM | POA: Diagnosis not present

## 2021-09-15 DIAGNOSIS — M25561 Pain in right knee: Secondary | ICD-10-CM | POA: Diagnosis not present

## 2021-09-15 DIAGNOSIS — M25562 Pain in left knee: Secondary | ICD-10-CM | POA: Diagnosis not present

## 2021-10-06 NOTE — Progress Notes (Signed)
? ?New Patient Office Visit ? ?Subjective:  ?Patient ID: Andre Pierce., male    DOB: 1991-04-01  Age: 31 y.o. MRN: [id number] ? ?CC:  ?Chief Complaint  ?Patient presents with  ? Establish Care  ?  Np. Est care. Pt c/o of snoring heavy w/ periods of no breathing during sleep. Pt states pain in both knees x2-3 weeks  ? ? ?HPI ?Andre Pierce. presents for presents for new patient visit to establish care.  Introduced to Publishing rights manager role and practice setting.  All questions answered.  Discussed provider/patient relationship and expectations. ? ?His wife told him that he stopped breathing while he sleeps and he has also been told that he snores loudly for the past several years. Endorses feeling tired after work. Denies headaches.  ? ?Both of his knees have been hurting for the past 2 weeks. He went to urgent care and was given naproxen which helped, but he ran out of the medication. He also has been taking tylenol.  ? ?KNEE PAIN ? ?Duration: weeks ?Involved knee: bilateral ?Mechanism of injury: unknown ?Location:diffuse ?Onset: sudden ?Severity: 5/10  ?Quality: throbbing, sore ?Frequency: intermittent ?Radiation: no ?Aggravating factors: walking  ?Alleviating factors: APAP, NSAIDs, and rest  ?Status: stable ?Treatments attempted: aleve, brace ?Relief with NSAIDs?:  moderate ?Weakness with weight bearing or walking: no ?Sensation of giving way: no ?Locking: no ?Popping: no ?Bruising: no ?Swelling: no ?Redness: no ?Paresthesias/decreased sensation: no ?Fevers: no ? ? ?Past Medical History:  ?Diagnosis Date  ? Eczema   ? Hemorrhoid   ? ? ?Past Surgical History:  ?Procedure Laterality Date  ? CYST EXCISION Right   ? wrist  ? ? ?Family History  ?Problem Relation Age of Onset  ? Healthy Mother   ? Healthy Father   ? ? ?Social History  ? ?Socioeconomic History  ? Marital status: Single  ?  Spouse name: Not on file  ? Number of children: Not on file  ? Years of education: Not on file  ? Highest education level:  Not on file  ?Occupational History  ? Not on file  ?Tobacco Use  ? Smoking status: Former  ?  Packs/day: 0.50  ?  Types: Cigarettes  ?  Quit date: 2018  ?  Years since quitting: 5.1  ? Smokeless tobacco: Never  ?Vaping Use  ? Vaping Use: Never used  ?Substance and Sexual Activity  ? Alcohol use: Yes  ?  Alcohol/week: 14.0 standard drinks  ?  Types: 14 Shots of liquor per week  ? Drug use: No  ? Sexual activity: Yes  ?  Birth control/protection: None  ?Other Topics Concern  ? Not on file  ?Social History Narrative  ? Not on file  ? ?Social Determinants of Health  ? ?Financial Resource Strain: Not on file  ?Food Insecurity: Not on file  ?Transportation Needs: Not on file  ?Physical Activity: Not on file  ?Stress: Not on file  ?Social Connections: Not on file  ?Intimate Partner Violence: Not on file  ? ? ?ROS ?Review of Systems  ?Constitutional:  Positive for fatigue.  ?HENT: Negative.    ?Eyes: Negative.   ?Respiratory: Negative.    ?Cardiovascular: Negative.   ?Gastrointestinal: Negative.   ?Endocrine: Negative.   ?Genitourinary: Negative.   ?Musculoskeletal:  Positive for arthralgias (bilateral knee pain).  ?Skin: Negative.   ?Neurological: Negative.   ?Psychiatric/Behavioral: Negative.    ? ?Objective:  ? ?Today's Vitals: BP 130/90   Pulse 93   Temp (!) 97.4 ?  F (36.3 ?C) (Temporal)   Ht 5\' 7"  (1.702 m)   Wt 151 lb 12.8 oz (68.9 kg)   SpO2 96%   BMI 23.78 kg/m?  ? ?Physical Exam ?Vitals and nursing note reviewed.  ?Constitutional:   ?   Appearance: Normal appearance.  ?HENT:  ?   Head: Normocephalic and atraumatic.  ?   Right Ear: Tympanic membrane, ear canal and external ear normal.  ?   Left Ear: Tympanic membrane, ear canal and external ear normal.  ?   Nose: Nose normal.  ?   Mouth/Throat:  ?   Mouth: Mucous membranes are moist.  ?   Pharynx: Oropharynx is clear.  ?Eyes:  ?   Conjunctiva/sclera: Conjunctivae normal.  ?Cardiovascular:  ?   Rate and Rhythm: Normal rate and regular rhythm.  ?   Pulses: Normal  pulses.  ?   Heart sounds: Normal heart sounds.  ?Pulmonary:  ?   Effort: Pulmonary effort is normal.  ?   Breath sounds: Normal breath sounds.  ?Abdominal:  ?   General: Bowel sounds are normal.  ?   Palpations: Abdomen is soft.  ?   Tenderness: There is no abdominal tenderness.  ?Musculoskeletal:     ?   General: No swelling or tenderness. Normal range of motion.  ?   Cervical back: Normal range of motion and neck supple. No tenderness.  ?   Comments: Bilateral knees with normal ROM. No tenderness or edema. No laxity or joint unstableness. Negative drawer test bilaterally. Strength 5/5 bilaterally.   ?Lymphadenopathy:  ?   Cervical: No cervical adenopathy.  ?Skin: ?   General: Skin is warm and dry.  ?Neurological:  ?   General: No focal deficit present.  ?   Mental Status: He is alert and oriented to person, place, and time.  ?   Cranial Nerves: No cranial nerve deficit.  ?   Gait: Gait normal.  ?Psychiatric:     ?   Mood and Affect: Mood normal.     ?   Behavior: Behavior normal.     ?   Thought Content: Thought content normal.     ?   Judgment: Judgment normal.  ? ? ?Assessment & Plan:  ? ?Problem List Items Addressed This Visit   ? ?  ? Other  ? Acute pain of both knees  ?  Acute X2 weeks.  Bilateral knee exam WNL.  Naproxen daily was helping with symptoms, will refill.  Stretches given to complete daily.  Wear a brace while at work.  If he is having ongoing symptoms, will order x-rays.  Follow-up in 4 weeks. ?  ?  ? Snoring - Primary  ?  Ongoing snoring and stopping breathing while asleep per his wife.  He endorses some fatigue during the day after work.  He denies headaches.  His diastolic blood pressure is slightly elevated.  Will order home sleep study.  ?  ?  ? Relevant Orders  ? Ambulatory referral to Sleep Studies  ? ? ?Outpatient Encounter Medications as of 10/08/2021  ?Medication Sig  ? naproxen (NAPROSYN) 500 MG tablet Take 1 tablet (500 mg total) by mouth 2 (two) times daily with a meal.  ?  [DISCONTINUED] naproxen (NAPROSYN) 500 MG tablet Take 1 tablet (500 mg total) by mouth 2 (two) times daily.  ? diclofenac Sodium (VOLTAREN) 1 % GEL Apply 1 application. topically 3 (three) times daily as needed.  ? [DISCONTINUED] HYDROcodone-acetaminophen (NORCO/VICODIN) 5-325 MG tablet Take 1-2 tablets by  mouth every 6 (six) hours as needed.  ? [DISCONTINUED] ketoconazole (NIZORAL) 2 % cream Apply once daily to area in groin. (Patient not taking: Reported on 10/08/2021)  ? [DISCONTINUED] tiZANidine (ZANAFLEX) 4 MG tablet Take 1 tablet (4 mg total) by mouth every 6 (six) hours as needed for muscle spasms. (Patient not taking: Reported on 10/08/2021)  ? ?No facility-administered encounter medications on file as of 10/08/2021.  ? ? ?Follow-up: Return in about 4 weeks (around 11/05/2021) for CPE.  ? ?Gerre Scull, NP ? ?

## 2021-10-08 ENCOUNTER — Other Ambulatory Visit: Payer: Self-pay

## 2021-10-08 ENCOUNTER — Ambulatory Visit: Payer: BLUE CROSS/BLUE SHIELD | Admitting: Nurse Practitioner

## 2021-10-08 ENCOUNTER — Encounter: Payer: Self-pay | Admitting: Nurse Practitioner

## 2021-10-08 VITALS — BP 130/90 | HR 93 | Temp 97.4°F | Ht 67.0 in | Wt 151.8 lb

## 2021-10-08 DIAGNOSIS — M25562 Pain in left knee: Secondary | ICD-10-CM | POA: Diagnosis not present

## 2021-10-08 DIAGNOSIS — R0683 Snoring: Secondary | ICD-10-CM

## 2021-10-08 DIAGNOSIS — M25561 Pain in right knee: Secondary | ICD-10-CM

## 2021-10-08 DIAGNOSIS — G8929 Other chronic pain: Secondary | ICD-10-CM | POA: Insufficient documentation

## 2021-10-08 MED ORDER — NAPROXEN 500 MG PO TABS: 500.0000 mg | ORAL_TABLET | Freq: Two times a day (BID) | ORAL | 0 refills | Status: DC

## 2021-10-08 NOTE — Assessment & Plan Note (Signed)
Ongoing snoring and stopping breathing while asleep per his wife.  He endorses some fatigue during the day after work.  He denies headaches.  His diastolic blood pressure is slightly elevated.  Will order home sleep study.  ?

## 2021-10-08 NOTE — Patient Instructions (Signed)
It was great to see you! ? ?Wear a knee brace while you are working. Take naproxen twice a day with food to help with pain. You can also use ice as needed to help with the pain.  ? ?I am going to order a home sleep study.  ? ?Let's follow-up in 4 weeks, sooner if you have concerns. ? ?If a referral was placed today, you will be contacted for an appointment. Please note that routine referrals can sometimes take up to 3-4 weeks to process. Please call our office if you haven't heard anything after this time frame. ? ?Take care, ? ?Rodman Pickle, NP ? ?

## 2021-10-08 NOTE — Assessment & Plan Note (Signed)
Acute X2 weeks.  Bilateral knee exam WNL.  Naproxen daily was helping with symptoms, will refill.  Stretches given to complete daily.  Wear a brace while at work.  If he is having ongoing symptoms, will order x-rays.  Follow-up in 4 weeks. ?

## 2021-10-20 ENCOUNTER — Encounter: Payer: Self-pay | Admitting: Nurse Practitioner

## 2021-10-20 ENCOUNTER — Other Ambulatory Visit: Payer: Self-pay

## 2021-10-20 ENCOUNTER — Ambulatory Visit: Payer: BLUE CROSS/BLUE SHIELD | Admitting: Nurse Practitioner

## 2021-10-20 VITALS — BP 110/80 | HR 78 | Temp 97.7°F | Ht 67.0 in | Wt 151.2 lb

## 2021-10-20 DIAGNOSIS — H1033 Unspecified acute conjunctivitis, bilateral: Secondary | ICD-10-CM | POA: Diagnosis not present

## 2021-10-20 MED ORDER — NEOMYCIN-POLYMYXIN-GRAMICIDIN 1.75-10000-.025 OP SOLN: 1.0000 [drp] | Freq: Every day | OPHTHALMIC | 0 refills | Status: AC

## 2021-10-20 NOTE — Progress Notes (Signed)
? ?  Acute Office Visit ? ?Subjective:  ? ? Patient ID: Andre Pierce., male    DOB: Sep 25, 1990, 31 y.o.   MRN: [id number] ? ?Chief Complaint  ?Patient presents with  ? Acute Visit  ?  Pt c/o eye irritation, swelling, and redness x 2-3 days. Pt states his daughter was diagnosed with pink eye on Friday.  ? ?HPI ?Patient is in today for eye irritation. Onset of Friday, reports eye redness, itching, foreign object sensation, and purulent drainage, contact with daughter with similar symptoms, no use of contact lens, no change in vision, no eye injury. ? ?Outpatient Medications Prior to Visit  ?Medication Sig  ? diclofenac Sodium (VOLTAREN) 1 % GEL Apply 1 application. topically 3 (three) times daily as needed.  ? naproxen (NAPROSYN) 500 MG tablet Take 1 tablet (500 mg total) by mouth 2 (two) times daily with a meal.  ? ?No facility-administered medications prior to visit.  ? ?Reviewed past medical and social history.  ?Review of Systems Per HPI ? ?   ?Objective:  ?  ?Physical Exam ?Vitals reviewed.  ?Eyes:  ?   General: No allergic shiner.    ?   Right eye: Discharge present. No foreign body or hordeolum.     ?   Left eye: Discharge present.No foreign body or hordeolum.  ?   Extraocular Movements:  ?   Right eye: Normal extraocular motion.  ?   Left eye: Normal extraocular motion.  ?   Conjunctiva/sclera:  ?   Right eye: Chemosis present. No exudate or hemorrhage. ?   Left eye: Chemosis present. No exudate or hemorrhage. ?Neurological:  ?   Mental Status: He is alert.  ? ?BP 110/80 (BP Location: Left Arm, Patient Position: Sitting, Cuff Size: Large)   Pulse 78   Temp 97.7 ?F (36.5 ?C) (Temporal)   Ht 5\' 7"  (1.702 m)   Wt 151 lb 3.2 oz (68.6 kg)   BMI 23.68 kg/m?  ? ? ?No results found for any visits on 10/20/21. ? ?   ?Assessment & Plan:  ? ?Problem List Items Addressed This Visit   ?None ?Visit Diagnoses   ? ? Acute bacterial conjunctivitis of right eye    -  Primary  ? Relevant Medications  ?  neomycin-polymyxin-gramicidin (NEOSPORIN) 1.75-10000-.025 ophthalmic solution  ? ?  ? ?Meds ordered this encounter  ?Medications  ? neomycin-polymyxin-gramicidin (NEOSPORIN) 1.75-10000-.025 ophthalmic solution  ?  Sig: Place 1 drop into both eyes 5 (five) times daily for 7 days.  ?  Dispense:  10 mL  ?  Refill:  0  ?  Order Specific Question:   Supervising Provider  ?  Answer:   Abelino Derrick ALFRED [[id number]]  ? ?Return if symptoms worsen or fail to improve. ? ?Wilfred Lacy, NP ?

## 2021-10-20 NOTE — Patient Instructions (Signed)
Allergic Conjunctivitis, Adult ?Allergic conjunctivitis is inflammation of the conjunctiva. The conjunctiva is the thin, clear membrane that covers the white part of the eye and the inner surface of the eyelid. In this condition: ?The blood vessels in the conjunctiva become irritated and swell. ?The eyes become red or pink and feel itchy. ?Allergic conjunctivitis cannot be spread from person to person. This condition can develop at any age and may be outgrown. ?What are the causes? ?This condition is caused by allergens. These are things that can cause an allergic reaction in some people but not in other people. Common allergens include: ?Outdoor allergens, such as: ?Pollen, including pollen from grass and weeds. ?Mold spores. ?Car fumes. ?Indoor allergens, such as: ?Dust. ?Smoke. ?Mold spores. ?Proteins in a pet's urine, saliva, or dander. ?What increases the risk? ?You may be more likely to develop this condition if you have a family history of these things: ?Allergies. ?Conditions caused by being exposed to allergens, such as: ?Allergic rhinitis. This is an allergic reaction that affects the nose. ?Bronchial asthma. This condition affects the large airways in the lungs and makes breathing difficult. ?Atopic dermatitis (eczema). This is inflammation of the skin that is long-term (chronic). ?What are the signs or symptoms? ?Symptoms of this condition include eyes that are: ?Itchy. ?Red. ?Watery. ?Puffy. ?Your eyes may also: ?Sting or burn. ?Have clear fluid draining from them. ?Have thick mucus discharge and pain (vernal conjunctivitis). ?How is this diagnosed? ?This condition may be diagnosed by: ?Your medical history. ?A physical exam. ?Tests of the fluid draining from your eyes to rule out other causes. ?Other tests to confirm the diagnosis, including: ?Testing for allergies. The skin may be pricked with a tiny needle. The pricked area is then exposed to small amounts of allergens. ?Testing for other eye  conditions. Tests may include: ?Blood tests. ?Tissue scrapings from your eyelid. The tissue is then checked under a microscope. ?How is this treated? ?This condition may be treated with: ?Cold, wet cloths (cold compresses) to soothe itching and swelling. ?Washing the face to remove allergens. ?Eye drops. These may be prescription or over-the-counter. You may need to try different types to see which one works best for you, such as: ?Eye drops that block the allergic reaction (antihistamine). ?Eye drops that reduce swelling and irritation (anti-inflammatory). ?Steroid eye drops, which may be given if other treatments have not worked (vernal conjunctivitis). ?Oral antihistamine medicines. These are medicines taken by mouth to lessen your allergic reaction. You may need these if eye drops do not help or are difficult to use. ?Follow these instructions at home: ?Eye care ?Apply a clean, cold compress to your eyes for 10-20 minutes, 3-4 times a day. ?Do not touch or rub your eyes. ?Do not wear contact lenses until the inflammation is gone. Wear glasses instead. ?Do not wear eye makeup until the inflammation is gone. ?General instructions ?Avoid known allergens whenever possible. ?Take or apply over-the-counter and prescription medicines only as told by your health care provider. These include any eye drops. ?Drink enough fluid to keep your urine pale yellow. ?Keep all follow-up visits as told by your health care provider. This is important. ?Contact a health care provider if: ?Your symptoms get worse or do not get better with treatment. ?You have mild eye pain. ?You become sensitive to light. ?You have spots or blisters on your eyes. ?You have pus draining from your eyes. ?You have a fever. ?Get help right away if: ?You have redness, swelling, or other symptoms in   only one eye. ?Your vision is blurred or you have other vision changes. ?You have severe eye pain. ?Summary ?Allergic conjunctivitis is inflammation of the  clear membrane that covers the white part of the eye and the inner surface of the eyelid. ?Take or apply over-the-counter and prescription medicines only as told by your health care provider. These include eye drops. ?Do not touch or rub your eyes. ?Contact a health care provider if your symptoms get worse or do not get better with treatment. ?This information is not intended to replace advice given to you by your health care provider. Make sure you discuss any questions you have with your health care provider. ?Document Revised: 06/12/2019 Document Reviewed: 06/12/2019 ?Elsevier Patient Education ? 2022 [employer] ? ?

## 2021-11-11 NOTE — Patient Instructions (Incomplete)
It was great to see you! ? ?We are checking your labs today and will call with the results.  ? ?Let's follow-up in *** ***, sooner if you have concerns. ? ?If a referral was placed today, you will be contacted for an appointment. Please note that routine referrals can sometimes take up to 3-4 weeks to process. Please call our office if you haven't heard anything after this time frame. ? ?Take care, ? ?Rodman Pickle, NP ? ?

## 2021-11-11 NOTE — Progress Notes (Deleted)
? ?There were no vitals taken for this visit.  ? ?Subjective:  ? ? Patient ID: Andre Pierce., male    DOB: [birth year], 31 y.o.   MRN: [id number] ? ?CC: ?No chief complaint on file. ? ?HPI: ?Andre Pierce. is a 31 y.o. male presenting on 11/12/2021 for comprehensive medical examination. Current medical complaints include:{Blank single:19197::"none","***"} ? ?He currently lives with: ?Interim Problems from his last visit: {Blank single:19197::"yes","no"} ? ?Depression Screen done today and results listed below:  ? ?  10/08/2021  ?  4:00 PM  ?Depression screen PHQ 2/9  ?Decreased Interest 3  ?Down, Depressed, Hopeless 0  ?PHQ - 2 Score 3  ?Altered sleeping 1  ?Tired, decreased energy 1  ?Change in appetite 0  ?Feeling bad or failure about yourself  0  ?Trouble concentrating 0  ?Moving slowly or fidgety/restless 0  ?Suicidal thoughts 0  ?PHQ-9 Score 5  ?Difficult doing work/chores Not difficult at all  ? ? ?The patient {has/does not have:19849} a history of falls. I {did/did not:19850} complete a risk assessment for falls. A plan of care for falls {was/was not:19852} documented. ? ? ?Past Medical History:  ?Past Medical History:  ?Diagnosis Date  ? Eczema   ? Hemorrhoid   ? ? ?Surgical History:  ?Past Surgical History:  ?Procedure Laterality Date  ? CYST EXCISION Right   ? wrist  ? ? ?Medications:  ?Current Outpatient Medications on File Prior to Visit  ?Medication Sig  ? diclofenac Sodium (VOLTAREN) 1 % GEL Apply 1 application. topically 3 (three) times daily as needed.  ? naproxen (NAPROSYN) 500 MG tablet Take 1 tablet (500 mg total) by mouth 2 (two) times daily with a meal.  ? ?No current facility-administered medications on file prior to visit.  ? ? ?Allergies:  ?No Known Allergies ? ?Social History:  ?Social History  ? ?Socioeconomic History  ? Marital status: Single  ?  Spouse name: Not on file  ? Number of children: Not on file  ? Years of education: Not on file  ? Highest education level: Not on file   ?Occupational History  ? Not on file  ?Tobacco Use  ? Smoking status: Former  ?  Packs/day: 0.50  ?  Types: Cigarettes  ?  Quit date: 2018  ?  Years since quitting: 5.2  ? Smokeless tobacco: Never  ?Vaping Use  ? Vaping Use: Never used  ?Substance and Sexual Activity  ? Alcohol use: Yes  ?  Alcohol/week: 14.0 standard drinks  ?  Types: 14 Shots of liquor per week  ? Drug use: No  ? Sexual activity: Yes  ?  Birth control/protection: None  ?Other Topics Concern  ? Not on file  ?Social History Narrative  ? Not on file  ? ?Social Determinants of Health  ? ?Financial Resource Strain: Not on file  ?Food Insecurity: Not on file  ?Transportation Needs: Not on file  ?Physical Activity: Not on file  ?Stress: Not on file  ?Social Connections: Not on file  ?Intimate Partner Violence: Not on file  ? ?Social History  ? ?Tobacco Use  ?Smoking Status Former  ? Packs/day: 0.50  ? Types: Cigarettes  ? Quit date: 2018  ? Years since quitting: 5.2  ?Smokeless Tobacco Never  ? ?Social History  ? ?Substance and Sexual Activity  ?Alcohol Use Yes  ? Alcohol/week: 14.0 standard drinks  ? Types: 14 Shots of liquor per week  ? ? ?Family History:  ?Family History  ?Problem Relation Age of  Onset  ? Healthy Mother   ? Healthy Father   ? ? ?Past medical history, surgical history, medications, allergies, family history and social history reviewed with patient today and changes made to appropriate areas of the chart.  ? ?ROS ?All other ROS negative except what is listed above and in the HPI.  ? ?   ?Objective:  ?  ?There were no vitals taken for this visit.  ?Wt Readings from Last 3 Encounters:  ?10/20/21 151 lb 3.2 oz (68.6 kg)  ?10/08/21 151 lb 12.8 oz (68.9 kg)  ?03/17/12 137 lb (62.1 kg)  ?  ?Physical Exam ? ?Results for orders placed or performed during the hospital encounter of 09/05/20  ?Cytology (oral, anal, urethral) ancillary only  ?Result Value Ref Range  ? Neisseria Gonorrhea Negative   ? Chlamydia Negative   ? Trichomonas Negative   ?  Comment Normal Reference Range Trichomonas - Negative   ? Comment Normal Reference Ranger Chlamydia - Negative   ? Comment    ?  Normal Reference Range Neisseria Gonorrhea - Negative  ? ?   ?Assessment & Plan:  ? ?Problem List Items Addressed This Visit   ?None ?  ? ?IMMUNIZATIONS:   ?- Tdap: Tetanus vaccination status reviewed: last tetanus booster within 10 years. ?- Influenza: Postponed to flu season ?- Pneumovax: Not applicable ?- Prevnar: Not applicable ?- HPV: Not applicable ?- Zostavax vaccine: Not applicable ? ?SCREENING: ?- Colonoscopy: Not applicable  ?Discussed with patient purpose of the colonoscopy is to detect colon cancer at curable precancerous or early stages  ? ?- AAA Screening: Not applicable  ?-Hearing Test: Not applicable  ?-Spirometry: Not applicable  ? ?PATIENT COUNSELING:   ? ?Sexuality: Discussed sexually transmitted diseases, partner selection, use of condoms, avoidance of unintended pregnancy  and contraceptive alternatives.  ? ?Advised to avoid cigarette smoking. ? ?I discussed with the patient that most people either abstain from alcohol or drink within safe limits (<=14/week and <=4 drinks/occasion for males, <=7/weeks and <= 3 drinks/occasion for females) and that the risk for alcohol disorders and other health effects rises proportionally with the number of drinks per week and how often a drinker exceeds daily limits. ? ?Discussed cessation/primary prevention of drug use and availability of treatment for abuse.  ? ?Diet: Encouraged to adjust caloric intake to maintain  or achieve ideal body weight, to reduce intake of dietary saturated fat and total fat, to limit sodium intake by avoiding high sodium foods and not adding table salt, and to maintain adequate dietary potassium and calcium preferably from fresh fruits, vegetables, and low-fat dairy products.   ? ?stressed the importance of regular exercise ? ?Injury prevention: Discussed safety belts, safety helmets, smoke detector,  smoking near bedding or upholstery.  ? ?Dental health: Discussed importance of regular tooth brushing, flossing, and dental visits.  ? ?Follow up plan: ?NEXT PREVENTATIVE PHYSICAL DUE IN 1 YEAR. ?No follow-ups on file. ?

## 2021-11-12 ENCOUNTER — Telehealth: Payer: Self-pay | Admitting: Nurse Practitioner

## 2021-11-12 ENCOUNTER — Encounter: Payer: BLUE CROSS/BLUE SHIELD | Admitting: Nurse Practitioner

## 2021-11-12 DIAGNOSIS — Z0000 Encounter for general adult medical examination without abnormal findings: Secondary | ICD-10-CM

## 2021-11-12 DIAGNOSIS — Z1159 Encounter for screening for other viral diseases: Secondary | ICD-10-CM

## 2021-11-12 DIAGNOSIS — Z114 Encounter for screening for human immunodeficiency virus [HIV]: Secondary | ICD-10-CM

## 2021-11-12 DIAGNOSIS — Z13220 Encounter for screening for lipoid disorders: Secondary | ICD-10-CM

## 2021-11-12 NOTE — Telephone Encounter (Signed)
1st no show, fee waived as one time courtesy ?

## 2021-11-12 NOTE — Telephone Encounter (Signed)
Patient/Caregiver was notified of No Show/Late Cancellation Policy & possible $50 charge. ?Visit was cancelled with reason "No Show/Cancel within 24 hours" for tracking & charging. ? ?Caller Name: Andre Pierce ?Caller Ph #: [phone] ?Date of APPT: 11/12/21 ?Reason given for no show/late cancellation: no reason no call  ?No Show Letter printed & put in outgoing mail (Yes/No): yes ? ?~~~Route message to admin supervisor and clinical team/CMA~~~ ? ?  ?

## 2021-12-22 ENCOUNTER — Ambulatory Visit (INDEPENDENT_AMBULATORY_CARE_PROVIDER_SITE_OTHER): Payer: BLUE CROSS/BLUE SHIELD

## 2021-12-22 ENCOUNTER — Ambulatory Visit: Payer: BLUE CROSS/BLUE SHIELD | Admitting: Nurse Practitioner

## 2021-12-22 ENCOUNTER — Encounter: Payer: Self-pay | Admitting: Nurse Practitioner

## 2021-12-22 VITALS — BP 130/78 | HR 104 | Temp 97.1°F | Wt 151.8 lb

## 2021-12-22 DIAGNOSIS — G8929 Other chronic pain: Secondary | ICD-10-CM

## 2021-12-22 DIAGNOSIS — M25562 Pain in left knee: Secondary | ICD-10-CM | POA: Diagnosis not present

## 2021-12-22 DIAGNOSIS — M25561 Pain in right knee: Secondary | ICD-10-CM | POA: Diagnosis not present

## 2021-12-22 DIAGNOSIS — R0683 Snoring: Secondary | ICD-10-CM | POA: Diagnosis not present

## 2021-12-22 MED ORDER — NAPROXEN 500 MG PO TABS: 500.0000 mg | ORAL_TABLET | Freq: Two times a day (BID) | ORAL | 1 refills | Status: AC

## 2021-12-22 NOTE — Assessment & Plan Note (Signed)
Chronic, ongoing.  He states that he has not heard yet from the referral for sleep study.  We will reach out to referral coroner to follow-up on this.

## 2021-12-22 NOTE — Assessment & Plan Note (Signed)
Chronic, ongoing.  He states that he is still having significant pain in his knees while he is working.  He has tried changing his shoes, wearing a knee brace, standing on a mat while working.  The naproxen does help with his pain, however he ran out of this medication.  We will send in a refill for naproxen 500 mg twice daily.  We will also order an x-ray of both knees today.  If he is still having ongoing pain, can consider knee joint injection.

## 2021-12-22 NOTE — Progress Notes (Signed)
Established Patient Office Visit  Subjective   Patient ID: Andre Pierce., male    DOB: 1991-07-06  Age: 31 y.o. MRN: [id number]  Chief Complaint  Patient presents with   Knee Pain    Pt c/o still having pain in both knees, and moments of not breathing w/ snoring while sleeping.     HPI  [name] is here today to follow-up on snoring and bilateral knee pain.   He has been having bilateral knee pain for the last few months while at work.  He changed his shoes to more comfortable, tennis shoes for work. He is still having bilateral knee pain at work. 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 He wears a knee brace at work and stands on a mat. He was taking naproxen twice a day, which do help, however he ran out of this medication. He denies swelling. He had to leave work early today due to the pain.  He is also following up on his snoring and episodes where he stops breathing at night.  He states that he has not heard yet on the sleep study referral.  Past Medical History:  Diagnosis Date   Eczema    Hemorrhoid    Past Surgical History:  Procedure Laterality Date   CYST EXCISION Right    wrist   ROS See pertinent positives and negatives per HPI.   Objective:     BP 130/78 (BP Location: Right Arm, Cuff Size: Normal)   Pulse (!) 104   Temp (!) 97.1 F (36.2 C) (Temporal)   Wt 151 lb 12.8 oz (68.9 kg)   SpO2 96%   BMI 23.78 kg/m     Physical Exam Vitals and nursing note reviewed.  Constitutional:      Appearance: Normal appearance.  HENT:     Head: Normocephalic.  Eyes:     Conjunctiva/sclera: Conjunctivae normal.  Cardiovascular:     Rate and Rhythm: Normal rate and regular rhythm.     Pulses: Normal pulses.     Heart sounds: Normal heart sounds.  Pulmonary:     Effort: Pulmonary effort is normal.     Breath sounds: Normal breath sounds.  Musculoskeletal:        General: No swelling or tenderness. Normal range of motion.     Cervical back:  Normal range of motion.     Comments: Negative anterior/posterior drawer test  Skin:    General: Skin is warm.  Neurological:     General: No focal deficit present.     Mental Status: He is alert and oriented to person, place, and time.  Psychiatric:        Mood and Affect: Mood normal.        Behavior: Behavior normal.        Thought Content: Thought content normal.        Judgment: Judgment normal.     Assessment & Plan:   Problem List Items Addressed This Visit       Other   Chronic pain of both knees - Primary    Chronic, ongoing.  He states that he is still having significant pain in his knees while he is working.  He has tried changing his shoes, wearing a knee brace, standing on a mat while working.  The naproxen does help with his pain, however he ran out of this medication.  We will send in a refill for naproxen 500 mg twice daily.  We will also  order an x-ray of both knees today.  If he is still having ongoing pain, can consider knee joint injection.       Relevant Medications   naproxen (NAPROSYN) 500 MG tablet   Other Relevant Orders   DG KNEE 3 VIEW RIGHT   DG KNEE 3 VIEW LEFT   Snoring    Chronic, ongoing.  He states that he has not heard yet from the referral for sleep study.  We will reach out to referral coroner to follow-up on this.        Return for CPE.    Gerre Scull, NP

## 2021-12-22 NOTE — Patient Instructions (Signed)
It was great to see you!  We are checking x-rays of your knees and will let you know the results.   I am following up on your referral for the sleep study  Let's follow-up in 4 weeks, sooner if you have concerns.  If a referral was placed today, you will be contacted for an appointment. Please note that routine referrals can sometimes take up to 3-4 weeks to process. Please call our office if you haven't heard anything after this time frame.  Take care,  Rodman Pickle, NP

## 2021-12-23 NOTE — Progress Notes (Signed)
Called and informed patient of results and provider instructions. Patient voiced understanding.

## 2022-02-12 ENCOUNTER — Ambulatory Visit: Payer: BLUE CROSS/BLUE SHIELD | Admitting: Neurology

## 2022-02-12 ENCOUNTER — Encounter: Payer: Self-pay | Admitting: Neurology

## 2022-02-12 VITALS — BP 136/85 | HR 99 | Ht 66.0 in | Wt 159.8 lb

## 2022-02-12 DIAGNOSIS — E663 Overweight: Secondary | ICD-10-CM | POA: Diagnosis not present

## 2022-02-12 DIAGNOSIS — G4730 Sleep apnea, unspecified: Secondary | ICD-10-CM | POA: Diagnosis not present

## 2022-02-12 DIAGNOSIS — R0681 Apnea, not elsewhere classified: Secondary | ICD-10-CM

## 2022-02-12 DIAGNOSIS — R0683 Snoring: Secondary | ICD-10-CM

## 2022-02-12 DIAGNOSIS — G4719 Other hypersomnia: Secondary | ICD-10-CM

## 2022-02-12 NOTE — Progress Notes (Signed)
Subjective:    Patient ID: Andre Pierce. is a 31 y.o. male.  HPI    Andre Foley, MD, PhD [facility] [address] [address] [place], [place] [address]  Dear Andre Pierce,   I saw your patient, Andre Pierce, upon your kind request in my sleep clinic today for initial consultation of his sleep disorder, in particular, concern for underlying obstructive sleep apnea.  The patient is accompanied by his wife and 6 mo daughter, Andre Pierce, today.  As you know, Andre Pierce is a 31 year old right-handed gentleman with an underlying medical history of eczema, and knee pain, who reports snoring and excessive daytime somnolence as well as witnessed apneas, per wife's report. I reviewed your office note from 10/08/2021.  His Epworth sleepiness score is 12 out of 24, fatigue severity score is 14 out of 63.  Very occasionally, will he wake up with a sense of gasping.  He denies night to night nocturia or recurrent morning headaches.  He lives with his wife and 4 daughters ages 41, 79, 83 and 6 months.  They have no pets in the household.  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  Bedtime is generally between 11 and midnight and rise time around 5:50 AM.  He does not drink caffeine daily.  He quit smoking in 2018.  He drinks alcohol about 4 times a week up to 2 shots.  He does have a TV on in his bedroom and his wife typically turns it off when she goes to sleep.  His mother is currently undergoing evaluation for sleep apnea as well.  His Past Medical History Is Significant For: Past Medical History:  Diagnosis Date   Eczema    Hemorrhoid     His Past Surgical History Is Significant For: Past Surgical History:  Procedure Laterality Date   CYST EXCISION Right    wrist    His Family History Is Significant For: Family History  Problem Relation Age of Onset   Healthy Mother    Healthy Father    Dementia Maternal Grandmother    Cancer Paternal Grandmother     His Social History  Is Significant For: Social History   Socioeconomic History   Marital status: Single    Spouse name: Not on file   Number of children: Not on file   Years of education: Not on file   Highest education level: Not on file  Occupational History   Not on file  Tobacco Use   Smoking status: Former    Packs/day: 0.50    Types: Cigarettes    Quit date: 2018    Years since quitting: 5.5   Smokeless tobacco: Never  Vaping Use   Vaping Use: Never used  Substance and Sexual Activity   Alcohol use: Yes    Alcohol/week: 14.0 standard drinks of alcohol    Types: 14 Shots of liquor per week    Comment: take 1-2 daily shots   Drug use: No   Sexual activity: Yes    Birth control/protection: None  Other Topics Concern   Not on file  Social History Narrative   Caffeine rare.    Education 10 th   [employer]..     Social Determinants of Health   Financial Resource Strain: Not on file  Food Insecurity: Not on file  Transportation Needs: Not on file  Physical Activity: Not on file  Stress: Not on file  Social Connections: Not on file    His Allergies Are:  No Known Allergies:   His Current Medications Are:  Outpatient Encounter Medications as of 02/12/2022  Medication Sig   diclofenac Sodium (VOLTAREN) 1 % GEL Apply 1 application. topically 3 (three) times daily as needed.   naproxen (NAPROSYN) 500 MG tablet Take 1 tablet (500 mg total) by mouth 2 (two) times daily with a meal.   No facility-administered encounter medications on file as of 02/12/2022.  :   Review of Systems:  Out of a complete 14 point review of systems, all are reviewed and negative with the exception of these symptoms as listed below:  Review of Systems  Neurological:        Snoring, wife has noted apnea.  ESS  12, FSS  14.    Objective:  Neurological Exam  Physical Exam Physical Examination:   Vitals:   02/12/22 1400  BP: 136/85  Pulse: 99    General Examination: The patient is a very pleasant 31  y.o. male in no acute distress. He appears well-developed and well-nourished and well groomed.   HEENT: Normocephalic, atraumatic, pupils are equal, round and reactive to light, extraocular tracking is good without limitation to gaze excursion or nystagmus noted. Hearing is grossly intact. Face is symmetric with normal facial animation. Speech is clear with no dysarthria noted. There is no hypophonia. There is no lip, neck/head, jaw or voice tremor. Neck is supple with full range of passive and active motion. There are no carotid bruits on auscultation. Oropharynx exam reveals: mild mouth dryness, good dental hygiene and moderate airway crowding, due to small airway entry, prominent uvula, tonsillar size of about 1-2+ bilaterally.  Tongue protrudes centrally and palate elevates symmetrically, neck circumference of 15-1/8 inches.  He has a minimal overbite.  Chest: Clear to auscultation without wheezing, rhonchi or crackles noted.  Heart: S1+S2+0, regular and normal without murmurs, rubs or gallops noted.   Abdomen: Soft, non-tender and non-distended.  Extremities: There is no pitting edema in the distal lower extremities bilaterally.   Skin: Warm and dry without trophic changes noted.   Musculoskeletal: exam reveals no obvious joint deformities, tenderness or joint swelling or erythema.   Neurologically:  Mental status: The patient is awake, alert and oriented in all 4 spheres. His immediate and remote memory, attention, language skills and fund of knowledge are appropriate. There is no evidence of aphasia, agnosia, apraxia or anomia. Speech is clear with normal prosody and enunciation. Thought process is linear. Mood is normal and affect is normal.  Cranial nerves II - XII are as described above under HEENT exam.  Motor exam: Normal bulk, strength and tone is noted. There is no tremor, Romberg is negative. Reflexes are 2+ throughout. Fine motor skills and coordination: grossly intact.   Cerebellar testing: No dysmetria or intention tremor. There is no truncal or gait ataxia.  Sensory exam: intact to light touch in the upper and lower extremities.  Gait, station and balance: He stands easily. No veering to one side is noted. No leaning to one side is noted. Posture is age-appropriate and stance is narrow based. Gait shows normal stride length and normal pace. No problems turning are noted.   Assessment and Plan:  In summary, Tahir Blank. is a very pleasant 31 y.o.-year old male with an underlying medical history of eczema, and knee pain, whose history and physical exam concerning for sleep disordered breathing, supporting a current working diagnosis of unspecified sleep apnea, with the main differential diagnoses of obstructive sleep apnea (OSA) versus upper airway  resistance syndrome (UARS) versus central sleep apnea (CSA), or mixed sleep apnea. A laboratory attended sleep study is considered gold standard for evaluation of sleep disordered breathing and is recommended at this time and clinically justified.   I had a long chat with the patient and his wife about my findings and the diagnosis of sleep apnea, particularly OSA, its prognosis and treatment options. We talked about medical/conservative treatments, surgical interventions and non-pharmacological approaches for symptom control. I explained, in particular, the risks and ramifications of untreated moderate to severe OSA, especially with respect to developing cardiovascular disease down the road, including congestive heart failure (CHF), difficult to treat hypertension, cardiac arrhythmias (particularly A-fib), neurovascular complications including TIA, stroke and dementia. Even type 2 diabetes has, in part, been linked to untreated OSA. Symptoms of untreated OSA may include (but may not be limited to) daytime sleepiness, nocturia (i.e. frequent nighttime urination), memory problems, mood irritability and suboptimally  controlled or worsening mood disorder such as depression and/or anxiety, lack of energy, lack of motivation, physical discomfort, as well as recurrent headaches, especially morning or nocturnal headaches. We talked about the importance of maintaining a healthy lifestyle and striving for healthy weight.  In addition, we talked about the importance of striving for and maintaining good sleep hygiene. I recommended the following at this time: sleep study.  I outlined the differences between a laboratory attended sleep study which is considered more comprehensive and accurate over the option of a home sleep test (HST); the latter may lead to underestimation of sleep disordered breathing in some instances and does not help with diagnosing upper airway resistance syndrome and is not accurate enough to diagnose primary central sleep apnea typically. I explained the different sleep test procedures to the patient in detail and also outlined possible surgical and non-surgical treatment options of OSA, including the use of a pressure airway pressure (PAP) device (ie CPAP, AutoPAP/APAP or BiPAP in certain circumstances), a custom-made dental device (aka oral appliance, which would require a referral to a specialist dentist or orthodontist typically, and is generally speaking not considered a good choice for patients with full dentures or edentulous state), upper airway surgical options, such as traditional UPPP (which is not considered a first-line treatment) or the Inspire device (hypoglossal nerve stimulator, which would involve a referral for consultation with an ENT surgeon, after careful selection, following inclusion criteria). I explained the PAP treatment option to the patient in detail, as this is generally considered first-line treatment.  The patient indicated that he would be willing to try PAP therapy, if the need arises. I explained the importance of being compliant with PAP treatment, not only for insurance  purposes but primarily to improve patient's symptoms symptoms, and for the patient's long term health benefit, including to reduce His cardiovascular risks longer-term.    We will pick up our discussion about the next steps and treatment options after testing.  We will keep them posted as to the test results by phone call and/or MyChart messaging where possible.  We will plan to follow-up in sleep clinic accordingly as well.  I answered all their questions today and the patient and his wife were in agreement.   I encouraged him to call with any interim questions, concerns, problems or updates or email [place] through MyChart.  Generally speaking, sleep test authorizations may take up to 2 weeks, sometimes less, sometimes longer, the patient is encouraged to get in touch with [place] if they do not hear back from the sleep lab  staff directly within the next 2 weeks.  Thank you very much for allowing me to participate in the care of this nice patient. If I can be of any further assistance to you please do not hesitate to call me at [phone].  Sincerely,   Andre Foley, MD, PhD

## 2022-02-12 NOTE — Patient Instructions (Signed)

## 2022-02-24 ENCOUNTER — Telehealth: Payer: Self-pay | Admitting: Neurology

## 2022-02-24 NOTE — Telephone Encounter (Signed)
HST- [employer] Berkley Harvey: [id number] (exp. 02/18/22 to 04/18/22). Patient is scheduled at [facility] for 03/17/22 at 1:30 pm.  I mailed packet to the patient.

## 2022-03-17 ENCOUNTER — Ambulatory Visit: Payer: BLUE CROSS/BLUE SHIELD | Admitting: Neurology

## 2022-03-17 DIAGNOSIS — R0683 Snoring: Secondary | ICD-10-CM

## 2022-03-17 DIAGNOSIS — E663 Overweight: Secondary | ICD-10-CM

## 2022-03-17 DIAGNOSIS — R0681 Apnea, not elsewhere classified: Secondary | ICD-10-CM

## 2022-03-17 DIAGNOSIS — G4730 Sleep apnea, unspecified: Secondary | ICD-10-CM

## 2022-03-17 DIAGNOSIS — G4734 Idiopathic sleep related nonobstructive alveolar hypoventilation: Secondary | ICD-10-CM

## 2022-03-17 DIAGNOSIS — G4733 Obstructive sleep apnea (adult) (pediatric): Secondary | ICD-10-CM

## 2022-03-17 DIAGNOSIS — G4719 Other hypersomnia: Secondary | ICD-10-CM

## 2022-03-19 NOTE — Progress Notes (Signed)
See procedure note.

## 2022-03-19 NOTE — Addendum Note (Signed)
Addended by: Huston Foley on: 03/19/2022 04:40 PM   Modules accepted: Orders

## 2022-03-19 NOTE — Procedures (Signed)
   [facility]  HOME SLEEP TEST (Watch PAT) REPORT  STUDY DATE: 03/18/2022  DOB: [birth year]  MRN: [id number]  ORDERING CLINICIAN: Huston Foley, MD, PhD   REFERRING CLINICIAN: Gerre Scull, NP   CLINICAL INFORMATION/HISTORY: 31 year old right-handed gentleman with an underlying medical history of eczema, and knee pain, who reports snoring and excessive daytime somnolence as well as witnessed apneas.  Epworth sleepiness score: 12/24.  BMI: 25.5 kg/m  FINDINGS:   Sleep Summary:   Total Recording Time (hours, min): 9 hours, 39 min  Total Sleep Time (hours, min):  8 hours, 18 min  Percent REM (%):    34.8%   Respiratory Indices:   Calculated pAHI (per hour):  32.1/hour         REM pAHI:    39.7/hour       NREM pAHI: 28/hour  Central pAHI: 1/hour  Oxygen Saturation Statistics:    Oxygen Saturation (%) Mean: 90%   Minimum oxygen saturation (%):                 66%   O2 Saturation Range (%): 66-97%    O2 Saturation (minutes) <=88%: 105.1 min  Pulse Rate Statistics:   Pulse Mean (bpm):    99/min    Pulse Range (79-122/min)   IMPRESSION: OSA (obstructive sleep apnea), severe Nocturnal Hypoxemia  RECOMMENDATION:  This home sleep test demonstrates severe obstructive sleep apnea with a total AHI of 32.1/hour and O2 nadir of 66% with significant time below or at 88% saturation of over 100 minutes for the night, indicating nocturnal hypoxemia.  Snoring ranged from mild to louder.  Urgent treatment with positive airway pressure is highly recommended. This will require - ideally - a full night CPAP titration study for proper treatment settings, O2 monitoring and mask fitting. For now, the patient will be advised to proceed with an autoPAP titration/trial at home.  A laboratory attended titration study can be considered in the future for optimization of his treatment and better tolerance of therapy.  Alternative treatment options are limited secondary  to the severity of the patient's sleep disordered breathing. Please note, that untreated obstructive sleep apnea may carry additional perioperative morbidity. Patients with significant obstructive sleep apnea should receive perioperative PAP therapy and the surgeons and particularly the anesthesiologist should be informed of the diagnosis and the severity of the sleep disordered breathing. The patient should be cautioned not to drive, work at heights, or operate dangerous or heavy equipment when tired or sleepy. Review and reiteration of good sleep hygiene measures should be pursued with any patient. Other causes of the patient's symptoms, including circadian rhythm disturbances, an underlying mood disorder, medication effect and/or an underlying medical problem cannot be ruled out based on this test. Clinical correlation is recommended. The patient and his referring provider will be notified of the test results. The patient will be seen in follow up in sleep clinic at [facility].  I certify that I have reviewed the raw data recording prior to the issuance of this report in accordance with the standards of the American Academy of Sleep Medicine (AASM).  INTERPRETING PHYSICIAN:   Huston Foley, MD, PhD  Board Certified in Neurology and Sleep Medicine  [facility] [address] [place], [place] [address] [phone]

## 2022-03-23 ENCOUNTER — Telehealth: Payer: Self-pay | Admitting: *Deleted

## 2022-03-23 ENCOUNTER — Encounter: Payer: Self-pay | Admitting: *Deleted

## 2022-03-23 NOTE — Telephone Encounter (Signed)
LVM for patient to call back to go over sleep study results (03/23/2022)

## 2022-03-23 NOTE — Telephone Encounter (Signed)
Spoke to patient gave sleep study results. Pt chose [facility]  for DME . Informed patient about compliance. Wear AutoPAP 4+hours nightly and keep all f/u appointments so insurance can help pay for AutoPAP machine and supplies Gave patient [facility] number and made initial f/u 05/2022 . Pt expressed understanding and thanked me for calling. Faxed orders to [facility] this evening Per Dr Frances Furbish sent  referring MD sleep study results

## 2022-03-23 NOTE — Telephone Encounter (Signed)
-----   Message from Huston Foley, MD sent at 03/19/2022  4:40 PM EDT ----- Urgent set up requested due to severe desaturations. Patient referred by PCP, seen by me on 02/12/2022, patient had a HST on 03/18/2022.    Please call and notify the patient that the recent home sleep test showed obstructive sleep apnea in the severe range. I recommend treatment for this in the form of autoPAP, which means, that we don't have to bring him in for a sleep study with CPAP, but will let him start using a so called autoPAP machine at home, through a DME company (of his choice, or as per insurance requirement). The DME representative will fit the patient with a mask of choice, educate him on how to use the machine, how to put the mask on, etc. I have placed an order in the chart. Please send the order to a local DME, talk to patient, send report to referring MD. Please also reinforce the need for compliance with treatment. We will need a FU in sleep clinic for 10 weeks post-PAP set up, please arrange that with me or one of our NPs. Thanks,   Huston Foley, MD, PhD [facility])

## 2022-03-23 NOTE — Telephone Encounter (Signed)
Error

## 2022-03-30 DIAGNOSIS — G4733 Obstructive sleep apnea (adult) (pediatric): Secondary | ICD-10-CM | POA: Diagnosis not present

## 2022-04-30 DIAGNOSIS — G4733 Obstructive sleep apnea (adult) (pediatric): Secondary | ICD-10-CM | POA: Diagnosis not present

## 2022-05-26 ENCOUNTER — Encounter: Payer: Self-pay | Admitting: Adult Health

## 2022-05-26 ENCOUNTER — Ambulatory Visit: Payer: BLUE CROSS/BLUE SHIELD | Admitting: Adult Health

## 2022-05-26 VITALS — BP 139/89 | HR 94 | Ht 66.0 in | Wt 152.4 lb

## 2022-05-26 DIAGNOSIS — G4733 Obstructive sleep apnea (adult) (pediatric): Secondary | ICD-10-CM

## 2022-05-26 DIAGNOSIS — G4734 Idiopathic sleep related nonobstructive alveolar hypoventilation: Secondary | ICD-10-CM

## 2022-05-26 NOTE — Patient Instructions (Addendum)
Hightly recommend nightly use of your CPAP for severe sleep apnea, it is important that you are using at least greater than 4 hours per night for optimal benefit and per insurance requirements  You will be contacted to undergo overnight oximetry testing to ensure your oxygen levels are stable with use of CPAP    Follow up in 5 months or call earlier if needed

## 2022-05-26 NOTE — Progress Notes (Signed)
[facility] [address] [place]. [place] [address] [phone]       OFFICE FOLLOW UP NOTE  [name]. Date of Birth:  22-Jun-1991 Medical Record Number:  [id number]   Reason for visit: Initial CPAP follow-up    SUBJECTIVE:   CHIEF COMPLAINT:  Chief Complaint  Patient presents with   Obstructive Sleep Apnea    Pt reports he has to get used to it. He states he tried when he sleeps but then he eventually takes it off. Room 2 alone    HPI:   Update 05/26/2022 JM: Patient is being seen for initial CPAP compliance visit.  Completed HST 8/15 which showed severe OSA with total AHI of 32.1/h and O2 nadir of 66% with significant time below are at 88% saturation of over 100 minutes indicating nocturnal hypoxemia.  Urgent treatment with CPAP highly recommended.  CPAP initiated 8/28.  Review of compliance report over the past 58 days shows 42 out of 58 usage days for 72% compliance and only 1 day greater than 4 hours for 2% compliance.  Average usage 45 minutes.  Residual AHI 0.9 on AutoPap setting 7-14 with EPR level 3.  Pressure in the 95th percentile 7.4.  Leaks in the 95th percentile 3.9.  He reports some difficulty with his CPAP, at times will remove his mask while sleeping or other times will take off as he has difficulty sleeping with it on. At does work 8am-5pm, he lives with his 59 young children and wife, at times will fall asleep before placing mask on or will place mask on prior to going to bed but will need to get back up to care for his children and not place mask back on.  He feels like he tolerates full facemask well. One night, he did use for greater than 4 hours and felt a huge improvement the next day in regards to sleep quality and energy levels.  Epworth Sleepiness Scale 7/24 (prior to CPAP 12/24)    Consult visit 02/12/2022 Dr. Rexene Pierce: [name] is a 31 year old right-handed gentleman with an underlying medical history of eczema, and knee  pain, who reports snoring and excessive daytime somnolence as well as witnessed apneas, per wife's report. I reviewed your office note from 10/08/2021.  His Epworth sleepiness score is 12 out of 24, fatigue severity score is 14 out of 63.  Very occasionally, will he wake up with a sense of gasping.  He denies night to night nocturia or recurrent morning headaches.  He lives with his wife and 4 daughters ages 65, 31, 78 and 56 months.  They have no pets in the household.  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  Bedtime is generally between 11 and midnight and rise time around 5:50 AM.  He does not drink caffeine daily.  He quit smoking in 2018.  He drinks alcohol about 4 times a week up to 2 shots.  He does have a TV on in his bedroom and his wife typically turns it off when she goes to sleep.  His mother is currently undergoing evaluation for sleep apnea as well.     ROS:   14 system review of systems performed and negative with exception of those listed in HPI  PMH:  Past Medical History:  Diagnosis Date   Eczema    Hemorrhoid     PSH:  Past Surgical History:  Procedure Laterality Date   CYST EXCISION Right    wrist  Social History:  Social History   Socioeconomic History   Marital status: Single    Spouse name: Not on file   Number of children: Not on file   Years of education: Not on file   Highest education level: Not on file  Occupational History   Not on file  Tobacco Use   Smoking status: Former    Packs/day: 0.50    Types: Cigarettes    Quit date: 2018    Years since quitting: 5.8   Smokeless tobacco: Never  Vaping Use   Vaping Use: Never used  Substance and Sexual Activity   Alcohol use: Yes    Alcohol/week: 14.0 standard drinks of alcohol    Types: 14 Shots of liquor per week    Comment: take 1-2 daily shots   Drug use: No   Sexual activity: Yes    Birth control/protection: None  Other Topics Concern   Not on file  Social History Narrative   Caffeine rare.     Education 10 th   [employer]..     Social Determinants of Health   Financial Resource Strain: Not on file  Food Insecurity: Not on file  Transportation Needs: Not on file  Physical Activity: Not on file  Stress: Not on file  Social Connections: Not on file  Intimate Partner Violence: Not on file    Family History:  Family History  Problem Relation Age of Onset   Healthy Mother    Healthy Father    Dementia Maternal Grandmother    Cancer Paternal Grandmother     Medications:   Current Outpatient Medications on File Prior to Visit  Medication Sig Dispense Refill   diclofenac Sodium (VOLTAREN) 1 % GEL Apply 1 application. topically 3 (three) times daily as needed.     naproxen (NAPROSYN) 500 MG tablet Take 1 tablet (500 mg total) by mouth 2 (two) times daily with a meal. 180 tablet 1   No current facility-administered medications on file prior to visit.    Allergies:  No Known Allergies    OBJECTIVE:  Physical Exam  Vitals:   05/26/22 1522  BP: 139/89  Pulse: 94  Weight: 152 lb 6 oz (69.1 kg)  Height: 5\' 6"  (1.676 m)   Body mass index is 24.59 kg/m. No results found.   General: well developed, well nourished, very pleasant young African-American male, seated, in no evident distress Head: head normocephalic and atraumatic.   Neck: supple with no carotid or supraclavicular bruits Cardiovascular: regular rate and rhythm, no murmurs Musculoskeletal: no deformity Skin:  no rash/petichiae Vascular:  Normal pulses all extremities   Neurologic Exam Mental Status: Awake and fully alert. Oriented to place and time. Recent and remote memory intact. Attention span, concentration and fund of knowledge appropriate. Mood and affect appropriate.  Cranial Nerves: Pupils equal, briskly reactive to light. Extraocular movements full without nystagmus. Visual fields full to confrontation. Hearing intact. Facial sensation intact. Face, tongue, palate moves normally and  symmetrically.  Motor: Normal bulk and tone. Normal strength in all tested extremity muscles Sensory.: intact to touch , pinprick , position and vibratory sensation.  Coordination: Rapid alternating movements normal in all extremities. Finger-to-nose and heel-to-shin performed accurately bilaterally. Gait and Station: Arises from chair without difficulty. Stance is normal. Gait demonstrates normal stride length and balance without use of AD.  Reflexes: 1+ and symmetric. Toes downgoing.         ASSESSMENT/PLAN: Jesper Stirewalt. is a 31 y.o. year old male  OSA on CPAP : Compliance report shows low usage although optimal residual AHI when using.  Discussed ways to help improve tolerance and desensitization.  Discussed importance of nightly usage with ensuring greater than 4 hours per night for optimal benefit and for insurance requirements. Will place order for ONO testing due to evidence of nocturnal hypoxemia on HST testing, did advise importance of using his CPAP machine during this testing for the entire duration of sleeping to ensure accurate results.  He verbalized understanding.  Continue to follow with DME company for any needed supplies or CPAP related concerns     Follow up in 5 months or call earlier if needed   CC:  PCP: McElwee, Lauren A, NP    I spent 26 minutes of face-to-face and non-face-to-face time with patient.  This included previsit chart review, lab review, study review, order entry, electronic health record documentation, patient education regarding diagnosis of sleep apnea with review and discussion of compliance report and answered all other questions to patient's satisfaction  Frann Rider, [facility]  [facility] [address] [place], [place] [address]  Phone [phone] Fax [phone] Note: This document was prepared with digital dictation and possible smart phrase technology. Any transcriptional errors that  result from this process are unintentional.

## 2022-05-27 NOTE — Progress Notes (Signed)
CPAP Orders has been faxed to DME on file [facility] 05/27/22.

## 2022-05-30 DIAGNOSIS — G4733 Obstructive sleep apnea (adult) (pediatric): Secondary | ICD-10-CM | POA: Diagnosis not present

## 2022-06-02 DIAGNOSIS — Z23 Encounter for immunization: Secondary | ICD-10-CM | POA: Diagnosis not present

## 2022-07-01 ENCOUNTER — Telehealth: Payer: Self-pay

## 2022-07-01 NOTE — Telephone Encounter (Signed)
Sleep lab found patient's CPAP machine and supplies in our home sleep test mailbox on 08/29/2021. Reached out to [facility] to find out who's machine belonged to. Tammy confirmed today (07/01/22) that it belongs to this patient. Pt is noncompliant and DME sent him a letter stating he needed to return it to DME. Tammy picked up machine today.

## 2022-07-31 ENCOUNTER — Encounter: Payer: Self-pay | Admitting: Nurse Practitioner

## 2022-07-31 ENCOUNTER — Ambulatory Visit (INDEPENDENT_AMBULATORY_CARE_PROVIDER_SITE_OTHER): Payer: BLUE CROSS/BLUE SHIELD | Admitting: Nurse Practitioner

## 2022-07-31 VITALS — BP 144/100 | HR 102 | Temp 97.8°F | Ht 66.0 in | Wt 156.2 lb

## 2022-07-31 DIAGNOSIS — M25562 Pain in left knee: Secondary | ICD-10-CM

## 2022-07-31 DIAGNOSIS — G4733 Obstructive sleep apnea (adult) (pediatric): Secondary | ICD-10-CM

## 2022-07-31 DIAGNOSIS — M25561 Pain in right knee: Secondary | ICD-10-CM

## 2022-07-31 DIAGNOSIS — Z3009 Encounter for other general counseling and advice on contraception: Secondary | ICD-10-CM | POA: Diagnosis not present

## 2022-07-31 DIAGNOSIS — R030 Elevated blood-pressure reading, without diagnosis of hypertension: Secondary | ICD-10-CM

## 2022-07-31 DIAGNOSIS — G8929 Other chronic pain: Secondary | ICD-10-CM

## 2022-07-31 MED ORDER — DICLOFENAC SODIUM 1 % EX GEL
4.0000 g | Freq: Three times a day (TID) | CUTANEOUS | 2 refills | Status: DC | PRN
Start: 2022-07-31 — End: 2022-12-07

## 2022-07-31 NOTE — Assessment & Plan Note (Signed)
Blood pressure 144/100 today.  Discussed limiting salt in his diet and increasing exercise.  He states that he is a little anxious when he comes to the office.  Encouraged him to check his blood pressure at home at least once a week.  Follow-up in 3 months.

## 2022-07-31 NOTE — Progress Notes (Signed)
Acute Office Visit  Subjective:     Patient ID: Andre Derasmo., male    DOB: [birth year], 31 y.o.   MRN: [id number]  Chief Complaint  Patient presents with   Office Visit    Would like referral for Vasectomy  No other concerns  Refill on Gel    HPI Patient is in today for requesting referral for a vasectomy. He has 4 girls already and he and his wife discussed options. He is interested in referral for this.   His knee pain is well-controlled with the Voltaren gel.  He will also sometimes take naproxen as needed.  He needs a refill on the Voltaren gel today.  ROS See pertinent positives and negatives per HPI.     Objective:    BP (!) 144/100 (BP Location: Right Arm, Cuff Size: Large)   Pulse (!) 102   Temp 97.8 F (36.6 C) (Temporal)   Ht 5\' 6"  (1.676 m)   Wt 156 lb 3.2 oz (70.9 kg)   SpO2 92%   BMI 25.21 kg/m    Physical Exam Vitals and nursing note reviewed.  Constitutional:      Appearance: Normal appearance.  HENT:     Head: Normocephalic.  Eyes:     Conjunctiva/sclera: Conjunctivae normal.  Cardiovascular:     Rate and Rhythm: Normal rate and regular rhythm.     Pulses: Normal pulses.     Heart sounds: Normal heart sounds.  Pulmonary:     Effort: Pulmonary effort is normal.     Breath sounds: Normal breath sounds.  Musculoskeletal:     Cervical back: Normal range of motion.  Skin:    General: Skin is warm.  Neurological:     General: No focal deficit present.     Mental Status: He is alert and oriented to person, place, and time.  Psychiatric:        Mood and Affect: Mood normal.        Behavior: Behavior normal.        Thought Content: Thought content normal.        Judgment: Judgment normal.       Assessment & Plan:   Problem List Items Addressed This Visit       Respiratory   OSA (obstructive sleep apnea)    He was diagnosed with sleep apnea and was given a CPAP to use.  He states that he was trying to get used to it, however was  not using it too frequently and his insurance stopped paying for it.        Other   Chronic pain of both knees - Primary    Chronic, stable.  Continue Voltaren gel 3 times a day as needed, refill sent to the pharmacy.  Can also continue naproxen 500 mg twice a day as needed.      Elevated blood pressure reading    Blood pressure 144/100 today.  Discussed limiting salt in his diet and increasing exercise.  He states that he is a little anxious when he comes to the office.  Encouraged him to check his blood pressure at home at least once a week.  Follow-up in 3 months.      Other Visit Diagnoses     Encounter for vasectomy counseling       After discussing with patient, will place a referral to urology.   Relevant Orders   Ambulatory referral to Urology       Meds ordered this encounter  Medications   diclofenac Sodium (VOLTAREN) 1 % GEL    Sig: Apply 4 g topically 3 (three) times daily as needed.    Dispense:  100 g    Refill:  2    Return in about 3 months (around 10/30/2022) for CPE.  Gerre Scull, NP

## 2022-07-31 NOTE — Assessment & Plan Note (Signed)
He was diagnosed with sleep apnea and was given a CPAP to use.  He states that he was trying to get used to it, however was not using it too frequently and his insurance stopped paying for it.

## 2022-07-31 NOTE — Assessment & Plan Note (Signed)
Chronic, stable.  Continue Voltaren gel 3 times a day as needed, refill sent to the pharmacy.  Can also continue naproxen 500 mg twice a day as needed.

## 2022-07-31 NOTE — Patient Instructions (Signed)
It was great to see you!  Start checking your blood pressure at home, once a week. Even if this is at [facility] or [facility] in one of the machines. Try to limit the amount of salt in your diet.   I have refilled your voltaren gel.   I am placing a referral to urology to discuss vasectomy.   Let's follow-up in 3 months, sooner if you have concerns.  If a referral was placed today, you will be contacted for an appointment. Please note that routine referrals can sometimes take up to 3-4 weeks to process. Please call our office if you haven't heard anything after this time frame.  Take care,  Rodman Pickle, NP

## 2022-08-07 ENCOUNTER — Telehealth: Payer: Self-pay | Admitting: Nurse Practitioner

## 2022-08-07 NOTE — Telephone Encounter (Signed)
I called pt and gave him info.

## 2022-08-07 NOTE — Telephone Encounter (Signed)
Pt has a referral that was placed on 07/31/22 by Lauren with [facility]. This is in [facility] and it's to far for him. He would like a location in [place].

## 2022-08-25 DIAGNOSIS — Z3009 Encounter for other general counseling and advice on contraception: Secondary | ICD-10-CM | POA: Diagnosis not present

## 2022-10-26 ENCOUNTER — Encounter: Payer: Self-pay | Admitting: Adult Health

## 2022-10-26 ENCOUNTER — Ambulatory Visit: Payer: BLUE CROSS/BLUE SHIELD | Admitting: Adult Health

## 2022-10-26 NOTE — Progress Notes (Deleted)
[facility] [address] [place]. [place] [address] [phone]       OFFICE FOLLOW UP NOTE  Mr. Ismar Puzon. Date of Birth:  1990-08-22 Medical Record Number:  [id number]   Reason for visit: Initial CPAP follow-up    SUBJECTIVE:   CHIEF COMPLAINT:  No chief complaint on file.   HPI:   Update 10/26/2022 JM: Patient returns for CPAP compliance visit.          History provided for reference purposes only Update 05/26/2022 JM: Patient is being seen for initial CPAP compliance visit.  Completed HST 8/15 which showed severe OSA with total AHI of 32.1/h and O2 nadir of 66% with significant time below are at 88% saturation of over 100 minutes indicating nocturnal hypoxemia.  Urgent treatment with CPAP highly recommended.  CPAP initiated 8/28.  Review of compliance report over the past 58 days shows 42 out of 58 usage days for 72% compliance and only 1 day greater than 4 hours for 2% compliance.  Average usage 45 minutes.  Residual AHI 0.9 on AutoPap setting 7-14 with EPR level 3.  Pressure in the 95th percentile 7.4.  Leaks in the 95th percentile 3.9.  He reports some difficulty with his CPAP, at times will remove his mask while sleeping or other times will take off as he has difficulty sleeping with it on. At does work 8am-5pm, he lives with his 57 young children and wife, at times will fall asleep before placing mask on or will place mask on prior to going to bed but will need to get back up to care for his children and not place mask back on.  He feels like he tolerates full facemask well. One night, he did use for greater than 4 hours and felt a huge improvement the next day in regards to sleep quality and energy levels.  Epworth Sleepiness Scale 7/24 (prior to CPAP 12/24)    Consult visit 02/12/2022 Dr. Rexene Alberts: Mr. Quant is a 32 year old right-handed gentleman with an underlying medical history of eczema, and knee pain, who reports snoring and  excessive daytime somnolence as well as witnessed apneas, per wife's report. I reviewed your office note from 10/08/2021.  His Epworth sleepiness score is 12 out of 24, fatigue severity score is 14 out of 63.  Very occasionally, will he wake up with a sense of gasping.  He denies night to night nocturia or recurrent morning headaches.  He lives with his wife and 4 daughters ages 81, 18, 71 and 70 months.  They have no pets in the household.  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  Bedtime is generally between 11 and midnight and rise time around 5:50 AM.  He does not drink caffeine daily.  He quit smoking in 2018.  He drinks alcohol about 4 times a week up to 2 shots.  He does have a TV on in his bedroom and his wife typically turns it off when she goes to sleep.  His mother is currently undergoing evaluation for sleep apnea as well.     ROS:   14 system review of systems performed and negative with exception of those listed in HPI  PMH:  Past Medical History:  Diagnosis Date   Eczema    Hemorrhoid     PSH:  Past Surgical History:  Procedure Laterality Date   CYST EXCISION Right    wrist    Social History:  Social History   Socioeconomic History   Marital  status: Single    Spouse name: Not on file   Number of children: Not on file   Years of education: Not on file   Highest education level: Not on file  Occupational History   Not on file  Tobacco Use   Smoking status: Former    Packs/day: .5    Types: Cigarettes    Quit date: 2018    Years since quitting: 6.2   Smokeless tobacco: Never  Vaping Use   Vaping Use: Never used  Substance and Sexual Activity   Alcohol use: Yes    Alcohol/week: 14.0 standard drinks of alcohol    Types: 14 Shots of liquor per week    Comment: take 1-2 daily shots   Drug use: No   Sexual activity: Yes    Birth control/protection: None  Other Topics Concern   Not on file  Social History Narrative   Caffeine rare.    Education 10 th   [employer]..      Social Determinants of Health   Financial Resource Strain: Not on file  Food Insecurity: Not on file  Transportation Needs: Not on file  Physical Activity: Not on file  Stress: Not on file  Social Connections: Not on file  Intimate Partner Violence: Not on file    Family History:  Family History  Problem Relation Age of Onset   Healthy Mother    Healthy Father    Dementia Maternal Grandmother    Cancer Paternal Grandmother     Medications:   Current Outpatient Medications on File Prior to Visit  Medication Sig Dispense Refill   diclofenac Sodium (VOLTAREN) 1 % GEL Apply 4 g topically 3 (three) times daily as needed. 100 g 2   naproxen (NAPROSYN) 500 MG tablet Take 1 tablet (500 mg total) by mouth 2 (two) times daily with a meal. 180 tablet 1   No current facility-administered medications on file prior to visit.    Allergies:  No Known Allergies    OBJECTIVE:  Physical Exam  There were no vitals filed for this visit.  There is no height or weight on file to calculate BMI. No results found.   General: well developed, well nourished, very pleasant young African-American male, seated, in no evident distress Head: head normocephalic and atraumatic.   Neck: supple with no carotid or supraclavicular bruits Cardiovascular: regular rate and rhythm, no murmurs Musculoskeletal: no deformity Skin:  no rash/petichiae Vascular:  Normal pulses all extremities   Neurologic Exam Mental Status: Awake and fully alert. Oriented to place and time. Recent and remote memory intact. Attention span, concentration and fund of knowledge appropriate. Mood and affect appropriate.  Cranial Nerves: Pupils equal, briskly reactive to light. Extraocular movements full without nystagmus. Visual fields full to confrontation. Hearing intact. Facial sensation intact. Face, tongue, palate moves normally and symmetrically.  Motor: Normal bulk and tone. Normal strength in all tested extremity  muscles Sensory.: intact to touch , pinprick , position and vibratory sensation.  Coordination: Rapid alternating movements normal in all extremities. Finger-to-nose and heel-to-shin performed accurately bilaterally. Gait and Station: Arises from chair without difficulty. Stance is normal. Gait demonstrates normal stride length and balance without use of AD.  Reflexes: 1+ and symmetric. Toes downgoing.         ASSESSMENT/PLAN: Sky Kirshenbaum. is a 32 y.o. year old male     OSA on CPAP : Compliance report shows low usage although optimal residual AHI when using.  Discussed ways to help improve tolerance  and desensitization.  Discussed importance of nightly usage with ensuring greater than 4 hours per night for optimal benefit and for insurance requirements. Will place order for ONO testing due to evidence of nocturnal hypoxemia on HST testing, did advise importance of using his CPAP machine during this testing for the entire duration of sleeping to ensure accurate results.  He verbalized understanding.  Continue to follow with DME company for any needed supplies or CPAP related concerns     Follow up in 5 months or call earlier if needed   CC:  PCP: McElwee, Lauren A, NP    I spent 26 minutes of face-to-face and non-face-to-face time with patient.  This included previsit chart review, lab review, study review, order entry, electronic health record documentation, patient education regarding diagnosis of sleep apnea with review and discussion of compliance report and answered all other questions to patient's satisfaction  Frann Rider, [facility]  [facility] [address] [place], [place] [address]  Phone [phone] Fax [phone] Note: This document was prepared with digital dictation and possible smart phrase technology. Any transcriptional errors that result from this process are unintentional.

## 2022-10-29 NOTE — Progress Notes (Signed)
BP (!) 158/110 (BP Location: Left Arm, Cuff Size: Normal)   Pulse 98   Temp 98.6 F (37 C)   Ht 5\' 6"  (1.676 m)   Wt 152 lb (68.9 kg)   SpO2 95%   BMI 24.53 kg/m    Subjective:    Patient ID: Andre Pilon., male    DOB: January 28, 1991, 32 y.o.   MRN: [id number]  CC: Chief Complaint  Patient presents with   Annual Exam    Concerns with numbness in bottom of feet    HPI: Andre Ganus. is a 32 y.o. male presenting on 11/02/2022 for comprehensive medical examination. Current medical complaints include: numbness on the bottom of his right foot.  Sometimes it will happen on his left foot. It lasts about 10 seconds and happens 2-3 times per month. He did not notice any triggering factors. There is no numbness going up his leg. He denies urinary frequency, chest pain, shortness of breath. He denies prior foot injury.   He has been checking his blood pressure at [employer] and notes it to be around 150s-160s/100s. He denies chest pain, shortness of breath, and headaches.   He currently lives with: wife, kids Interim Problems from his last visit: no  Depression and Anxiety Screen done today and results listed below:     11/02/2022    8:56 AM 10/08/2021    4:00 PM  Depression screen PHQ 2/9  Decreased Interest 1 3  Down, Depressed, Hopeless 0 0  PHQ - 2 Score 1 3  Altered sleeping 0 1  Tired, decreased energy 1 1  Change in appetite 0 0  Feeling bad or failure about yourself  0 0  Trouble concentrating 0 0  Moving slowly or fidgety/restless 0 0  Suicidal thoughts 0 0  PHQ-9 Score 2 5  Difficult doing work/chores Not difficult at all Not difficult at all      11/02/2022    8:57 AM 10/08/2021    4:00 PM  GAD 7 : Generalized Anxiety Score  Nervous, Anxious, on Edge 0 0  Control/stop worrying 0 0  Worry too much - different things 0 0  Trouble relaxing 0 0  Restless 0 0  Easily annoyed or irritable 0 0  Afraid - awful might happen 0 0  Total GAD 7 Score 0 0  Anxiety  Difficulty Not difficult at all Not difficult at all    The patient does not have a history of falls. I did not complete a risk assessment for falls. A plan of care for falls was not documented.   Past Medical History:  Past Medical History:  Diagnosis Date   Eczema    Hemorrhoid     Surgical History:  Past Surgical History:  Procedure Laterality Date   CYST EXCISION Right    wrist    Medications:  Current Outpatient Medications on File Prior to Visit  Medication Sig   diclofenac Sodium (VOLTAREN) 1 % GEL Apply 4 g topically 3 (three) times daily as needed.   naproxen (NAPROSYN) 500 MG tablet Take 1 tablet (500 mg total) by mouth 2 (two) times daily with a meal.   No current facility-administered medications on file prior to visit.    Allergies:  No Known Allergies  Social History:  Social History   Socioeconomic History   Marital status: Single    Spouse name: Not on file   Number of children: Not on file   Years of education: Not on file  Highest education level: Not on file  Occupational History   Not on file  Tobacco Use   Smoking status: Former    Packs/day: .5    Types: Cigarettes    Quit date: 2018    Years since quitting: 6.2   Smokeless tobacco: Never  Vaping Use   Vaping Use: Never used  Substance and Sexual Activity   Alcohol use: Yes    Alcohol/week: 14.0 standard drinks of alcohol    Types: 14 Shots of liquor per week    Comment: take 1-2 daily shots   Drug use: No   Sexual activity: Yes    Birth control/protection: None  Other Topics Concern   Not on file  Social History Narrative   Caffeine rare.    Education 10 th   [employer]     Social Determinants of Health   Financial Resource Strain: Not on file  Food Insecurity: Not on file  Transportation Needs: Not on file  Physical Activity: Not on file  Stress: Not on file  Social Connections: Not on file  Intimate Partner Violence: Not on file   Social History   Tobacco Use   Smoking Status Former   Packs/day: .5   Types: Cigarettes   Quit date: 2018   Years since quitting: 6.2  Smokeless Tobacco Never   Social History   Substance and Sexual Activity  Alcohol Use Yes   Alcohol/week: 14.0 standard drinks of alcohol   Types: 14 Shots of liquor per week   Comment: take 1-2 daily shots    Family History:  Family History  Problem Relation Age of Onset   Healthy Mother    Healthy Father    Dementia Maternal Grandmother    Cancer Paternal Grandmother     Past medical history, surgical history, medications, allergies, family history and social history reviewed with patient today and changes made to appropriate areas of the chart.   Review of Systems  Constitutional: Negative.   HENT: Negative.    Eyes:  Positive for blurred vision (wears glasses).  Respiratory: Negative.    Cardiovascular: Negative.   Gastrointestinal: Negative.   Genitourinary: Negative.   Musculoskeletal: Negative.   Skin: Negative.   Neurological:  Negative for dizziness and headaches.       Intermittent numbness on bottom of feet  Psychiatric/Behavioral: Negative.     All other ROS negative except what is listed above and in the HPI.      Objective:    BP (!) 158/110 (BP Location: Left Arm, Cuff Size: Normal)   Pulse 98   Temp 98.6 F (37 C)   Ht 5\' 6"  (1.676 m)   Wt 152 lb (68.9 kg)   SpO2 95%   BMI 24.53 kg/m   Wt Readings from Last 3 Encounters:  11/02/22 152 lb (68.9 kg)  07/31/22 156 lb 3.2 oz (70.9 kg)  05/26/22 152 lb 6 oz (69.1 kg)    Physical Exam Vitals and nursing note reviewed.  Constitutional:      General: He is not in acute distress.    Appearance: Normal appearance.  HENT:     Head: Normocephalic and atraumatic.     Right Ear: Tympanic membrane, ear canal and external ear normal.     Left Ear: Tympanic membrane, ear canal and external ear normal.  Eyes:     Conjunctiva/sclera: Conjunctivae normal.  Cardiovascular:     Rate and Rhythm:  Normal rate and regular rhythm.     Pulses: Normal pulses.  Heart sounds: Normal heart sounds.  Pulmonary:     Effort: Pulmonary effort is normal.     Breath sounds: Normal breath sounds.  Abdominal:     Palpations: Abdomen is soft.     Tenderness: There is no abdominal tenderness.  Musculoskeletal:        General: Normal range of motion.     Cervical back: Normal range of motion and neck supple. No tenderness.     Right lower leg: No edema.     Left lower leg: No edema.  Lymphadenopathy:     Cervical: No cervical adenopathy.  Skin:    General: Skin is warm and dry.  Neurological:     General: No focal deficit present.     Mental Status: He is alert and oriented to person, place, and time.     Cranial Nerves: No cranial nerve deficit.     Coordination: Coordination normal.     Gait: Gait normal.  Psychiatric:        Mood and Affect: Mood normal.        Behavior: Behavior normal.        Thought Content: Thought content normal.        Judgment: Judgment normal.     Results for orders placed or performed during the hospital encounter of 09/05/20  Cytology (oral, anal, urethral) ancillary only  Result Value Ref Range   Neisseria Gonorrhea Negative    Chlamydia Negative    Trichomonas Negative    Comment Normal Reference Range Trichomonas - Negative    Comment Normal Reference Ranger Chlamydia - Negative    Comment      Normal Reference Range Neisseria Gonorrhea - Negative      Assessment & Plan:   Problem List Items Addressed This Visit       Cardiovascular and Mediastinum   Primary hypertension    Chronic, not controlled.  BP today 158/110.  We discussed limiting salt in his diet and continuing to exercise.  Will have him start amlodipine 5 mg daily.  Discussed possible side effects.  Check CMP, CBC, lipid panel today.  Follow-up in 4 weeks.      Relevant Medications   amLODipine (NORVASC) 5 MG tablet   Other Relevant Orders   Lipid panel     Respiratory    OSA (obstructive sleep apnea)    He was diagnosed with sleep apnea and was given a CPAP to use.  He states that he was trying to get used to it, however was not using it too frequently and his insurance stopped paying for it.        Other   Chronic pain of both knees    Chronic, stable.  Continue Voltaren gel 3 times a day as needed, refill sent to the pharmacy.  Can also continue naproxen 500 mg twice a day as needed.      Other Visit Diagnoses     Encounter for general adult medical examination with abnormal findings    -  Primary   Health maintenance reviewed and updated. Discussed nutrition, exercise. Check CMP, CBC, TSH today. Follow-up 1 year   Relevant Orders   CBC with Differential/Platelet   Comprehensive metabolic panel   TSH   Numbness and tingling of both feet       He has noted numbness on bottom of right foot about 1-2x per month for 10 seconds. Exam WNL today. Check CMP, CBC, TSH, A1c, and vitamin B12   Relevant Orders   Hemoglobin  A1c   Vitamin B12   Screen for STD (sexually transmitted disease)       Screen STDs today.   Relevant Orders   HIV Antibody (routine testing w rflx)   RPR   HSV(herpes simplex vrs) 1+2 ab-IgG   Hepatitis C antibody   Urine cytology ancillary only   Immunization due       Td booster given today.   Relevant Orders   Td vaccine greater than or equal to 7yo preservative free IM (Completed)       IMMUNIZATIONS:   - Tdap: Tetanus vaccination status reviewed: given today - Influenza: Up to date - Pneumovax: Not applicable - Prevnar: Not applicable - HPV: Not applicable - Zostavax vaccine: Not applicable  SCREENING: - Colonoscopy: Not applicable  Discussed with patient purpose of the colonoscopy is to detect colon cancer at curable precancerous or early stages   - AAA Screening: Not applicable  -Hearing Test: Not applicable  -Spirometry: Not applicable   PATIENT COUNSELING:    Sexuality: Discussed sexually transmitted  diseases, partner selection, use of condoms, avoidance of unintended pregnancy  and contraceptive alternatives.   Advised to avoid cigarette smoking.  I discussed with the patient that most people either abstain from alcohol or drink within safe limits (<=14/week and <=4 drinks/occasion for males, <=7/weeks and <= 3 drinks/occasion for females) and that the risk for alcohol disorders and other health effects rises proportionally with the number of drinks per week and how often a drinker exceeds daily limits.  Discussed cessation/primary prevention of drug use and availability of treatment for abuse.   Diet: Encouraged to adjust caloric intake to maintain  or achieve ideal body weight, to reduce intake of dietary saturated fat and total fat, to limit sodium intake by avoiding high sodium foods and not adding table salt, and to maintain adequate dietary potassium and calcium preferably from fresh fruits, vegetables, and low-fat dairy products.    stressed the importance of regular exercise  Injury prevention: Discussed safety belts, safety helmets, smoke detector, smoking near bedding or upholstery.   Dental health: Discussed importance of regular tooth brushing, flossing, and dental visits.   Follow up plan: NEXT PREVENTATIVE PHYSICAL DUE IN 1 YEAR. Return in about 4 weeks (around 11/30/2022) for HTN.

## 2022-11-02 ENCOUNTER — Encounter: Payer: Self-pay | Admitting: Nurse Practitioner

## 2022-11-02 ENCOUNTER — Ambulatory Visit (INDEPENDENT_AMBULATORY_CARE_PROVIDER_SITE_OTHER): Payer: BLUE CROSS/BLUE SHIELD | Admitting: Nurse Practitioner

## 2022-11-02 ENCOUNTER — Other Ambulatory Visit (HOSPITAL_COMMUNITY)
Admission: RE | Admit: 2022-11-02 | Discharge: 2022-11-02 | Disposition: A | Discharge status: Home / Self Care | Payer: BLUE CROSS/BLUE SHIELD | Source: Ambulatory Visit | Attending: Nurse Practitioner | Admitting: Nurse Practitioner

## 2022-11-02 VITALS — BP 158/110 | HR 98 | Temp 98.6°F | Ht 66.0 in | Wt 152.0 lb

## 2022-11-02 DIAGNOSIS — M25562 Pain in left knee: Secondary | ICD-10-CM

## 2022-11-02 DIAGNOSIS — G8929 Other chronic pain: Secondary | ICD-10-CM

## 2022-11-02 DIAGNOSIS — Z0001 Encounter for general adult medical examination with abnormal findings: Secondary | ICD-10-CM | POA: Diagnosis not present

## 2022-11-02 DIAGNOSIS — R200 Anesthesia of skin: Secondary | ICD-10-CM | POA: Diagnosis not present

## 2022-11-02 DIAGNOSIS — Z23 Encounter for immunization: Secondary | ICD-10-CM

## 2022-11-02 DIAGNOSIS — Z0000 Encounter for general adult medical examination without abnormal findings: Secondary | ICD-10-CM

## 2022-11-02 DIAGNOSIS — I10 Essential (primary) hypertension: Secondary | ICD-10-CM

## 2022-11-02 DIAGNOSIS — R202 Paresthesia of skin: Secondary | ICD-10-CM

## 2022-11-02 DIAGNOSIS — Z113 Encounter for screening for infections with a predominantly sexual mode of transmission: Secondary | ICD-10-CM | POA: Diagnosis not present

## 2022-11-02 DIAGNOSIS — M25561 Pain in right knee: Secondary | ICD-10-CM | POA: Diagnosis not present

## 2022-11-02 DIAGNOSIS — G4733 Obstructive sleep apnea (adult) (pediatric): Secondary | ICD-10-CM | POA: Diagnosis not present

## 2022-11-02 LAB — CBC WITH DIFFERENTIAL/PLATELET
Basophils Absolute: 0.1 10*3/uL (ref 0.0–0.1)
Basophils Relative: 4 % — ABNORMAL HIGH (ref 0.0–3.0)
Eosinophils Absolute: 0 10*3/uL (ref 0.0–0.7)
Eosinophils Relative: 0.8 % (ref 0.0–5.0)
HCT: 39.8 % (ref 39.0–52.0)
Hemoglobin: 13.5 g/dL (ref 13.0–17.0)
Lymphocytes Relative: 58.5 % — ABNORMAL HIGH (ref 12.0–46.0)
Lymphs Abs: 1.4 10*3/uL (ref 0.7–4.0)
MCHC: 34 g/dL (ref 30.0–36.0)
MCV: 95.4 fl (ref 78.0–100.0)
Monocytes Absolute: 0.3 10*3/uL (ref 0.1–1.0)
Monocytes Relative: 11.7 % (ref 3.0–12.0)
Neutro Abs: 0.6 10*3/uL — ABNORMAL LOW (ref 1.4–7.7)
Neutrophils Relative %: 25 % — ABNORMAL LOW (ref 43.0–77.0)
Platelets: 224 10*3/uL (ref 150.0–400.0)
RBC: 4.17 Mil/uL — ABNORMAL LOW (ref 4.22–5.81)
RDW: 12.6 % (ref 11.5–15.5)
WBC: 2.3 10*3/uL — ABNORMAL LOW (ref 4.0–10.5)

## 2022-11-02 LAB — COMPREHENSIVE METABOLIC PANEL
ALT: 71 U/L — ABNORMAL HIGH (ref 0–53)
AST: 80 U/L — ABNORMAL HIGH (ref 0–37)
Albumin: 4.8 g/dL (ref 3.5–5.2)
Alkaline Phosphatase: 80 U/L (ref 39–117)
BUN: 7 mg/dL (ref 6–23)
CO2: 30 mEq/L (ref 19–32)
Calcium: 9.4 mg/dL (ref 8.4–10.5)
Chloride: 100 mEq/L (ref 96–112)
Creatinine, Ser: 0.54 mg/dL (ref 0.40–1.50)
GFR: 130.57 mL/min (ref >60.00)
Glucose, Bld: 127 mg/dL — ABNORMAL HIGH (ref 70–99)
Potassium: 3.1 mEq/L — ABNORMAL LOW (ref 3.5–5.1)
Sodium: 142 mEq/L (ref 135–145)
Total Bilirubin: 1 mg/dL (ref 0.2–1.2)
Total Protein: 7.4 g/dL (ref 6.0–8.3)

## 2022-11-02 LAB — TSH: TSH: 2.35 u[IU]/mL (ref 0.35–5.50)

## 2022-11-02 LAB — LIPID PANEL
Cholesterol: 278 mg/dL — ABNORMAL HIGH (ref 0–200)
HDL: 117 mg/dL (ref >39.00)
LDL Cholesterol: 143 mg/dL — ABNORMAL HIGH (ref 0–99)
NonHDL: 161.24
Total CHOL/HDL Ratio: 2
Triglycerides: 90 mg/dL (ref 0.0–149.0)
VLDL: 18 mg/dL (ref 0.0–40.0)

## 2022-11-02 LAB — HEMOGLOBIN A1C: Hgb A1c MFr Bld: 5 % (ref 4.6–6.5)

## 2022-11-02 LAB — VITAMIN B12: Vitamin B-12: 547 pg/mL (ref 211–911)

## 2022-11-02 MED ORDER — AMLODIPINE BESYLATE 5 MG PO TABS: 5.0000 mg | ORAL_TABLET | Freq: Every day | ORAL | 0 refills | Status: DC

## 2022-11-02 NOTE — Assessment & Plan Note (Signed)
He was diagnosed with sleep apnea and was given a CPAP to use.  He states that he was trying to get used to it, however was not using it too frequently and his insurance stopped paying for it. 

## 2022-11-02 NOTE — Patient Instructions (Signed)
It was great to see you!  We are checking your labs today and will let you know the results via mychart/phone.   Start amlodipine 1 tablet daily for your blood pressure. Keep checking your blood pressure at home and writing it down.  Let's follow-up in 4 weeks, sooner if you have concerns.  If a referral was placed today, you will be contacted for an appointment. Please note that routine referrals can sometimes take up to 3-4 weeks to process. Please call our office if you haven't heard anything after this time frame.  Take care,  Vance Peper, NP

## 2022-11-02 NOTE — Assessment & Plan Note (Signed)
Chronic, not controlled.  BP today 158/110.  We discussed limiting salt in his diet and continuing to exercise.  Will have him start amlodipine 5 mg daily.  Discussed possible side effects.  Check CMP, CBC, lipid panel today.  Follow-up in 4 weeks.

## 2022-11-02 NOTE — Assessment & Plan Note (Signed)
Chronic, stable.  Continue Voltaren gel 3 times a day as needed, refill sent to the pharmacy.  Can also continue naproxen 500 mg twice a day as needed. 

## 2022-11-03 LAB — URINE CYTOLOGY ANCILLARY ONLY
Chlamydia: NEGATIVE
Comment: NEGATIVE
Comment: NORMAL
Neisseria Gonorrhea: NEGATIVE

## 2022-11-03 LAB — HIV ANTIBODY (ROUTINE TESTING W REFLEX): HIV 1&2 Ab, 4th Generation: NONREACTIVE

## 2022-11-03 LAB — HEPATITIS C ANTIBODY: Hepatitis C Ab: NONREACTIVE

## 2022-11-03 LAB — HSV(HERPES SIMPLEX VRS) I + II AB-IGG
HAV 1 IGG,TYPE SPECIFIC AB: 37.8 index — ABNORMAL HIGH
HSV 2 IGG,TYPE SPECIFIC AB: <0.9 index

## 2022-11-03 LAB — RPR: RPR Ser Ql: NONREACTIVE

## 2022-11-04 ENCOUNTER — Telehealth: Payer: Self-pay | Admitting: Nurse Practitioner

## 2022-11-04 NOTE — Telephone Encounter (Signed)
Pt called and said that he is returning your call please call back

## 2022-11-04 NOTE — Telephone Encounter (Signed)
I returned patient's call and he had questions regarding his la results.  Questions answered.

## 2022-11-23 DIAGNOSIS — Z3009 Encounter for other general counseling and advice on contraception: Secondary | ICD-10-CM | POA: Diagnosis not present

## 2022-11-23 DIAGNOSIS — Z302 Encounter for sterilization: Secondary | ICD-10-CM | POA: Diagnosis not present

## 2022-12-07 ENCOUNTER — Encounter: Payer: Self-pay | Admitting: Nurse Practitioner

## 2022-12-07 ENCOUNTER — Ambulatory Visit: Payer: BLUE CROSS/BLUE SHIELD | Admitting: Nurse Practitioner

## 2022-12-07 VITALS — BP 130/86 | HR 99 | Temp 97.1°F | Ht 66.0 in | Wt 152.0 lb

## 2022-12-07 DIAGNOSIS — R7989 Other specified abnormal findings of blood chemistry: Secondary | ICD-10-CM

## 2022-12-07 DIAGNOSIS — G4733 Obstructive sleep apnea (adult) (pediatric): Secondary | ICD-10-CM | POA: Diagnosis not present

## 2022-12-07 DIAGNOSIS — I10 Essential (primary) hypertension: Secondary | ICD-10-CM

## 2022-12-07 LAB — COMPREHENSIVE METABOLIC PANEL
ALT: 46 U/L (ref 0–53)
AST: 48 U/L — ABNORMAL HIGH (ref 0–37)
Albumin: 4.5 g/dL (ref 3.5–5.2)
Alkaline Phosphatase: 56 U/L (ref 39–117)
BUN: 10 mg/dL (ref 6–23)
CO2: 26 mEq/L (ref 19–32)
Calcium: 9.3 mg/dL (ref 8.4–10.5)
Chloride: 101 mEq/L (ref 96–112)
Creatinine, Ser: 0.57 mg/dL (ref 0.40–1.50)
GFR: 128.37 mL/min (ref >60.00)
Glucose, Bld: 181 mg/dL — ABNORMAL HIGH (ref 70–99)
Potassium: 3.2 mEq/L — ABNORMAL LOW (ref 3.5–5.1)
Sodium: 141 mEq/L (ref 135–145)
Total Bilirubin: 0.5 mg/dL (ref 0.2–1.2)
Total Protein: 7.1 g/dL (ref 6.0–8.3)

## 2022-12-07 MED ORDER — DICLOFENAC SODIUM 1 % EX GEL: 4.0000 g | Freq: Three times a day (TID) | CUTANEOUS | 2 refills | Status: DC | PRN

## 2022-12-07 MED ORDER — AMLODIPINE BESYLATE 5 MG PO TABS: 5.0000 mg | ORAL_TABLET | Freq: Every day | ORAL | 1 refills | Status: DC

## 2022-12-07 NOTE — Assessment & Plan Note (Signed)
His LFTs were slightly elevated last visit. He is currently drinking 1-2 drinks per day. Discussed cutting back on alcohol. He is not taking tylenol, he uses NSAIDs prn pain. Will repeat CMP today. Follow-up in 6 months.

## 2022-12-07 NOTE — Progress Notes (Signed)
Established Patient Office Visit  Subjective   Patient ID: Andre Pierce., male    DOB: 02/15/1991  Age: 32 y.o. MRN: [id number]  Chief Complaint  Patient presents with   Breathing Problem    Wants to start back on CPAP   HPI:  Andre Pierce. Is here to follow-up on hypertension.   Last visit he was started on amlodipine 5mg  daily. He is not having any side effects to the medication. He got a blood pressure cuff and has been checking his blood pressure at home. He noticed his blood pressure has been 150s systolic. He denies chest pain, shortness of breath, headaches, and swelling in his legs.   He also notes that he wants to start back on his CPAP at night. He wasn't using the machine and his insurance stopped covering it. His wife has been telling him that he has been stopping breathing at night.   ROS See pertinent positives and negatives per HPI.    Objective:     BP 130/86 (BP Location: Left Arm)   Pulse 99   Temp (!) 97.1 F (36.2 C)   Ht 5\' 6"  (1.676 m)   Wt 152 lb (68.9 kg)   SpO2 94%   BMI 24.53 kg/m     Physical Exam Vitals and nursing note reviewed.  Constitutional:      Appearance: Normal appearance.  HENT:     Head: Normocephalic.  Eyes:     Conjunctiva/sclera: Conjunctivae normal.  Cardiovascular:     Rate and Rhythm: Normal rate and regular rhythm.     Pulses: Normal pulses.     Heart sounds: Normal heart sounds.  Pulmonary:     Effort: Pulmonary effort is normal.     Breath sounds: Normal breath sounds.  Musculoskeletal:     Cervical back: Normal range of motion.  Skin:    General: Skin is warm.  Neurological:     General: No focal deficit present.     Mental Status: He is alert and oriented to person, place, and time.  Psychiatric:        Mood and Affect: Mood normal.        Behavior: Behavior normal.        Thought Content: Thought content normal.        Judgment: Judgment normal.       Assessment & Plan:   Problem  List Items Addressed This Visit       Cardiovascular and Mediastinum   Primary hypertension - Primary    Chronic, stable. BP improved to 130/86. He is tolerating the amlodipine 5mg  daily without side effects. Refill sent to the pharmacy.       Relevant Medications   amLODipine (NORVASC) 5 MG tablet     Respiratory   OSA (obstructive sleep apnea)    Chronic, not controlled. He would like to go back on his CPAP. Gave him the phone number to [facility] sleep medicine who he was seeing before to discuss options.         Other   Elevated LFTs    His LFTs were slightly elevated last visit. He is currently drinking 1-2 drinks per day. Discussed cutting back on alcohol. He is not taking tylenol, he uses NSAIDs prn pain. Will repeat CMP today. Follow-up in 6 months.       Relevant Orders   Comp Met (CMET)    Return in about 6 months (around 06/09/2023) for HTN.  Charyl Dancer, NP

## 2022-12-07 NOTE — Patient Instructions (Signed)
It was great to see you!  Keep taking the amlodipine every day and checking your blood pressure once a week.   I have refilled your medicines.   Call the sleep medicine Dr. About your CPAP:  Phone: [phone]  Let's follow-up in 6 months, sooner if you have concerns.  If a referral was placed today, you will be contacted for an appointment. Please note that routine referrals can sometimes take up to 3-4 weeks to process. Please call our office if you haven't heard anything after this time frame.  Take care,  Rodman Pickle, NP

## 2022-12-07 NOTE — Assessment & Plan Note (Signed)
Chronic, stable. BP improved to 130/86. He is tolerating the amlodipine 5mg  daily without side effects. Refill sent to the pharmacy.

## 2022-12-07 NOTE — Assessment & Plan Note (Signed)
Chronic, not controlled. He would like to go back on his CPAP. Gave him the phone number to [facility] sleep medicine who he was seeing before to discuss options.

## 2023-02-03 ENCOUNTER — Ambulatory Visit: Payer: BLUE CROSS/BLUE SHIELD | Admitting: Nurse Practitioner

## 2023-02-03 ENCOUNTER — Ambulatory Visit (INDEPENDENT_AMBULATORY_CARE_PROVIDER_SITE_OTHER): Payer: BLUE CROSS/BLUE SHIELD | Admitting: Nurse Practitioner

## 2023-02-03 ENCOUNTER — Encounter: Payer: Self-pay | Admitting: Nurse Practitioner

## 2023-02-03 VITALS — BP 130/78 | HR 78 | Temp 98.1°F | Resp 16 | Ht 66.0 in | Wt 159.0 lb

## 2023-02-03 DIAGNOSIS — R21 Rash and other nonspecific skin eruption: Secondary | ICD-10-CM

## 2023-02-03 MED ORDER — PODOFILOX 0.5 % EX SOLN: CUTANEOUS | 0 refills | Status: DC

## 2023-02-03 NOTE — Progress Notes (Unsigned)
   Acute Office Visit  Subjective:     Patient ID: Andre Binda., male    DOB: 1991/02/01, 32 y.o.   MRN: [id number]  Chief Complaint  Patient presents with   Rash    Bumps on hand, thigh and groin area.     HPI Patient is in today for bumps on his hands.   It started on his hands about a week ago and then he noticed some on his thigh and penis about 2 days ago. He denies itching and pain. He just feels a bump. He denies drainage and fevers. He has not tried anything over the counter. He denies sick contacts and travel.   ROS      Objective:    BP 130/78 (BP Location: Left Arm, Patient Position: Sitting, Cuff Size: Large)   Pulse 78   Temp 98.1 F (36.7 C) (Oral)   Resp 16   Ht 5\' 6"  (1.676 m)   Wt 159 lb (72.1 kg)   SpO2 98%   BMI 25.66 kg/m  {Vitals History (Optional):23777}  Physical Exam  No results found for any visits on 02/03/23.      Assessment & Plan:   Problem List Items Addressed This Visit   None   No orders of the defined types were placed in this encounter.   No follow-ups on file.  Gerre Scull, NP

## 2023-02-03 NOTE — Patient Instructions (Signed)
It was great to see you!  Start podofilox cream at night on the areas on your thigh and penis. Place a thin layer and wash it off in the morning. Do this for 3 nights and then stop for 4 nights. You can do this up to 1 month.   Let's follow-up if your symptoms worsen or don't improve.   Take care,  Rodman Pickle, NP

## 2023-02-04 DIAGNOSIS — R21 Rash and other nonspecific skin eruption: Secondary | ICD-10-CM | POA: Insufficient documentation

## 2023-02-04 NOTE — Assessment & Plan Note (Signed)
Consistent with molloscum contagiosum. With areas to thigh and penis, will have him start podofolix cream at night to raised areas and wash off next morning for 3 nights, then stop for 4 nights. Discussed this is contagious and not to share towels or clothes with family. He was tested for RPR 3 months ago and denies further concern as he is in a monogamous relationship. Follow-up if symptoms worsen or any concerns.

## 2023-02-22 ENCOUNTER — Ambulatory Visit (INDEPENDENT_AMBULATORY_CARE_PROVIDER_SITE_OTHER): Payer: BLUE CROSS/BLUE SHIELD | Admitting: Internal Medicine

## 2023-02-22 ENCOUNTER — Encounter: Payer: Self-pay | Admitting: Internal Medicine

## 2023-02-22 VITALS — BP 130/86 | HR 107 | Temp 97.7°F | Ht 66.0 in | Wt 156.4 lb

## 2023-02-22 DIAGNOSIS — R197 Diarrhea, unspecified: Secondary | ICD-10-CM

## 2023-02-22 DIAGNOSIS — J00 Acute nasopharyngitis [common cold]: Secondary | ICD-10-CM

## 2023-02-22 LAB — POC COVID19 BINAXNOW: SARS Coronavirus 2 Ag: NEGATIVE

## 2023-02-22 LAB — POCT RAPID STREP A (OFFICE): Rapid Strep A Screen: NEGATIVE

## 2023-02-22 LAB — POCT INFLUENZA A/B
Influenza A, POC: NEGATIVE
Influenza B, POC: NEGATIVE

## 2023-02-22 NOTE — Patient Instructions (Addendum)
Do home COVID test in 2 days , notify PCP office if positive result.  Rest, drink plenty of fluids.  Tylenol / ibuprofen for fever, body aches.  Bland diet Imodium as needed if diarrhea persists.    For nasal and sinus congestion: Use over-the-counter Flonase: 2 nasal sprays on each side of the nose in the morning until you feel better  Zyrtec 10mg  once daily  Patients with high blood pressure can use Coricidin HBP for decongestant, it will not raise your blood pressure.   Call if not gradually better over the next  10 days  Call anytime if the symptoms are severe, you have high fever, short of breath, chest pain

## 2023-02-22 NOTE — Progress Notes (Signed)
[facility] [address] [place] [address] Dept: [phone] Dept Fax: [phone]  Acute Care Office Visit  Subjective:   Andre Pierce. 1990-12-02 02/22/2023  Chief Complaint  Patient presents with   Sore Throat   Generalized Body Aches    Started yesterday    HPI: Discussed the use of AI scribe software for clinical note transcription with the patient, who gave verbal consent to proceed.  History of Present Illness   The patient presents with a one-day history of a sore throat, body aches, sinus congestion, runny nose, and a mild cough. They also report watery diarrhea that started this morning, with two episodes so far. They have taken a cough and cold pill, which did not seem to alleviate their symptoms. They have not had any fever or chills, chest pain, or shortness of breath. They have not noticed any blood in their stool. They have been in contact with their daughter who attends a daycare where there have been three recent cases of COVID-19.      The following portions of the patient's history were reviewed and updated as appropriate: past medical history, past surgical history, family history, social history, allergies, medications, and problem list.   Patient Active Problem List   Diagnosis Date Noted   Rash 02/04/2023   Elevated LFTs 12/07/2022   Primary hypertension 11/02/2022   OSA (obstructive sleep apnea) 07/31/2022   Elevated blood pressure reading 07/31/2022   Chronic pain of both knees 10/08/2021   Snoring 10/08/2021   Past Medical History:  Diagnosis Date   Eczema    Hemorrhoid    Past Surgical History:  Procedure Laterality Date   CYST EXCISION Right    wrist   VASECTOMY     Family History  Problem Relation Age of Onset   Healthy Mother    Healthy Father    Dementia Maternal Grandmother    Cancer Paternal Grandmother    Outpatient Medications Prior to Visit  Medication Sig  Dispense Refill   amLODipine (NORVASC) 5 MG tablet Take 1 tablet (5 mg total) by mouth daily. 90 tablet 1   diclofenac Sodium (VOLTAREN) 1 % GEL Apply 4 g topically 3 (three) times daily as needed. 100 g 2   naproxen (NAPROSYN) 500 MG tablet Take 1 tablet (500 mg total) by mouth 2 (two) times daily with a meal. 180 tablet 1   podofilox (CONDYLOX) 0.5 % external solution Apply topically every 12 hours in the morning and evening for 3 days, then withhold for 4 days; repeat cycle up to 4 times 3.5 mL 0   No facility-administered medications prior to visit.   No Known Allergies   ROS: A complete ROS was performed with pertinent positives/negatives noted in the HPI. The remainder of the ROS are negative.    Objective:   Today's Vitals   02/22/23 1325  BP: 130/86  Pulse: (!) 107  Temp: 97.7 F (36.5 C)  TempSrc: Temporal  SpO2: 98%  Weight: 156 lb 6.4 oz (70.9 kg)  Height: 5\' 6"  (1.676 m)    GENERAL: Well-appearing, in NAD. Well nourished.  SKIN: Pink, warm and dry. No rash, lesion, ulceration, or ecchymoses.  NECK: Trachea midline. Full ROM w/o pain or tenderness. No lymphadenopathy.  HEENT:    HEAD: Normocephalic, non-traumatic.  EYES: Conjunctive pink without exudate. EARS: External ear w/o redness, swelling, masses, or lesions. EAC clear. TM's intact, translucent w/o bulging, appropriate landmarks visualized.  NOSE: Septum midline w/o  deformity. Nares patent, turbinates swollen with clear nasal drainage. No sinus tenderness.  THROAT: Uvula midline. Oropharynx  mild erythema. Tonsils non-inflamed w/o exudate. Mucus membranes pink and moist.  RESPIRATORY: Chest wall symmetrical. Respirations even and non-labored. Breath sounds clear to auscultation bilaterally.  CARDIAC: S1, S2 present, regular rate and rhythm. Peripheral pulses 2+ bilaterally.  EXTREMITIES: Without clubbing, cyanosis, or edema.  NEUROLOGIC:  Steady, even gait.  PSYCH/MENTAL STATUS: Alert, oriented x 3.  Cooperative, appropriate mood and affect.    Results for orders placed or performed in visit on 02/22/23  POCT rapid strep A  Result Value Ref Range   Rapid Strep A Screen Negative Negative  POC COVID-19 BinaxNow  Result Value Ref Range   SARS Coronavirus 2 Ag Negative Negative  POCT Influenza A/B  Result Value Ref Range   Influenza A, POC Negative Negative   Influenza B, POC Negative Negative      Assessment & Plan:  Assessment and Plan    Acute Upper Respiratory Infection: Sore throat, body aches, sinus congestion, runny nose, and mild cough. No fever, chest pain, or shortness of breath. Possible exposure to COVID-19 through daughter's daycare. Negative for COVID-19, flu, and strep tests, but early in the course of illness and could be a false negative. -Advise symptomatic treatment with Zyrtec, Flonase nasal spray, salt water gargles, Tylenol, and ibuprofen. -Repeat COVID-19 test at home in 2-3 days due to possible exposure and early symptom onset.  Acute Diarrhea: Watery diarrhea with two episodes. No blood. Possible postnasal drip causing upset stomach. -Advise bland diet, plenty of fluids, and Imodium if diarrhea persists.  Follow-up: If symptoms persist or worsen after 7 days, call for possible antibiotic treatment.      Lab Orders         POCT rapid strep A         POC COVID-19 BinaxNow         POCT Influenza A/B     No images are attached to the encounter or orders placed in the encounter.  Return if symptoms worsen or fail to improve.   Of note, portions of this note may have been created with voice recognition software [employer]icist, medical). While this note has been edited for accuracy, occasional wrong-word or 'sound-a-like' substitutions may have occurred due to the inherent limitations of voice recognition software.  Salvatore Decent, FNP

## 2023-03-16 DIAGNOSIS — Z3141 Encounter for fertility testing: Secondary | ICD-10-CM | POA: Diagnosis not present

## 2023-06-25 ENCOUNTER — Encounter: Payer: Self-pay | Admitting: Nurse Practitioner

## 2023-06-25 ENCOUNTER — Ambulatory Visit: Payer: BLUE CROSS/BLUE SHIELD | Admitting: Nurse Practitioner

## 2023-06-25 VITALS — BP 148/92 | HR 85 | Temp 97.3°F | Ht 66.0 in | Wt 153.0 lb

## 2023-06-25 DIAGNOSIS — L0291 Cutaneous abscess, unspecified: Secondary | ICD-10-CM

## 2023-06-25 DIAGNOSIS — I10 Essential (primary) hypertension: Secondary | ICD-10-CM | POA: Diagnosis not present

## 2023-06-25 MED ORDER — DOXYCYCLINE HYCLATE 100 MG PO TABS
100.0000 mg | ORAL_TABLET | Freq: Two times a day (BID) | ORAL | 0 refills | Status: DC
Start: 2023-06-25 — End: 2023-11-17

## 2023-06-25 MED ORDER — AMLODIPINE BESYLATE 5 MG PO TABS: 5.0000 mg | ORAL_TABLET | Freq: Every day | ORAL | 1 refills | Status: DC

## 2023-06-25 MED ORDER — LIDOCAINE-EPINEPHRINE 2 %-1:100000 IJ SOLN
1.0000 mL | Freq: Once | INTRAMUSCULAR | Status: AC
Start: 2023-06-25 — End: 2023-06-25
  Administered 2023-06-25: 1 mL

## 2023-06-25 NOTE — Assessment & Plan Note (Signed)
He ran out of antihypertensive medication for 1 week which is why his blood pressure is elevated today. Refill amlodipine 5mg  daily. Keep checking BP at home. Follow-up in 3 months.

## 2023-06-25 NOTE — Progress Notes (Signed)
Acute Office Visit  Subjective:     Patient ID: Andre Pierce., male    DOB: 1990/12/10, 32 y.o.   MRN: [id number]  Chief Complaint  Patient presents with   Boil on left shoulder    For 1 week on left shoulder, Rx Refill out of BP med for 1 week    HPI Patient is in today for boil to left posterior shoulder.   Discussed the use of AI scribe software for clinical note transcription with the patient, who gave verbal consent to proceed.  History of Present Illness   The patient presents with a boil on his back that has been present for approximately two weeks. He reports that it has been increasing in size and is painful, but he has not been applying any treatments or warm compresses. He has not noticed any drainage from the boil, but he has been unable to see it himself. He denies any associated fevers.  In addition to the boil, the patient has been out of his blood pressure medication for about a week. He has been monitoring his blood pressure at home, which he reports has been 'decent' prior to running out of medication. He denies any associated chest pain or shortness of breath.      ROS See pertinent positives and negatives per HPI.     Objective:    BP (!) 148/92   Pulse 85   Temp (!) 97.3 F (36.3 C)   Ht 5\' 6"  (1.676 m)   Wt 153 lb (69.4 kg)   SpO2 96%   BMI 24.69 kg/m    Physical Exam Vitals and nursing note reviewed.  Constitutional:      Appearance: Normal appearance.  HENT:     Head: Normocephalic.  Eyes:     Conjunctiva/sclera: Conjunctivae normal.  Pulmonary:     Effort: Pulmonary effort is normal.  Musculoskeletal:     Cervical back: Normal range of motion.  Skin:    General: Skin is warm.     Comments: 2 cm x 2 cm fluctuant abscess to left posterior shoulder  Neurological:     General: No focal deficit present.     Mental Status: He is alert and oriented to person, place, and time.  Psychiatric:        Mood and Affect: Mood normal.         Behavior: Behavior normal.        Thought Content: Thought content normal.        Judgment: Judgment normal.       Assessment & Plan:   Problem List Items Addressed This Visit       Cardiovascular and Mediastinum   Primary hypertension    He ran out of antihypertensive medication for 1 week which is why his blood pressure is elevated today. Refill amlodipine 5mg  daily. Keep checking BP at home. Follow-up in 3 months.       Relevant Medications   amLODipine (NORVASC) 5 MG tablet   Other Visit Diagnoses     Abscess    -  Primary   See I & D note. Start doxycycline 100mg  BID x10 days. Call for increased drainage, fevers, redness, increased pain.      Skin Procedure  Procedure: Incision and Drainage  Diagnosis:   ICD-10-CM   1. Abscess  L02.91    See I & D note. Start doxycycline 100mg  BID x10 days. Call for increased drainage, fevers, redness, increased pain.    2. Primary  hypertension  I10       Lesion Location/Size: Left posterior shoulder 2cm x 2cm Provider: Rodman Pickle, NP Consent:  Risks, benefits, and alternative treatments discussed and all questions were answered.  Patient elected to proceed and verbal consent obtained.  Description: Area prepped and draped using semi-sterile technique. Area locally anesthetized using 1 cc of lidocaine 2% with epi. Using a 15 blade scalpel, a  0.75 cm incision was made above the lesion. Cyst cavity entered and copious amount of purulent material expressed.  Cyst wall was removed in pieces using mosquito hemostat. Wound dressed with gauze and tape.  Complications: None Estimated blood loss: Minimal Post Procedure Instructions: Wound care instructions discussed and patient was instructed to keep area clean and dry.  Recommend to wash area with soap and water daily and cover with gauze and tape. There may be additional drainage noted on gauze. Signs and symptoms of infection discussed and patient encouraged to contact the clinic  ASAP for concerns.  Follow Up: as needed    Meds ordered this encounter  Medications   amLODipine (NORVASC) 5 MG tablet    Sig: Take 1 tablet (5 mg total) by mouth daily.    Dispense:  90 tablet    Refill:  1   doxycycline (VIBRA-TABS) 100 MG tablet    Sig: Take 1 tablet (100 mg total) by mouth 2 (two) times daily.    Dispense:  20 tablet    Refill:  0    Return in about 3 months (around 09/25/2023) for HTN.  Gerre Scull, NP

## 2023-06-25 NOTE — Patient Instructions (Signed)
It was great to see you!  Wash the area with soap and water daily. Cover with gauze or bandaid  It may drain more which is good.   Let me know if you get a fever, it refills and gets bigger, redness at the site  Start doxycycline twice a day for 10 days.   Let's follow-up in 3 months, sooner if you have concerns.  If a referral was placed today, you will be contacted for an appointment. Please note that routine referrals can sometimes take up to 3-4 weeks to process. Please call our office if you haven't heard anything after this time frame.  Take care,  Rodman Pickle, NP

## 2023-09-29 IMAGING — DX DG KNEE 3 VIEWS*R*
4 series · 4 of 4 positions shown · non-contrast
Comparison: None Available.

CLINICAL DATA: Chronic knee pain

EXAM:
RIGHT KNEE - 3 VIEW

[knee ap]
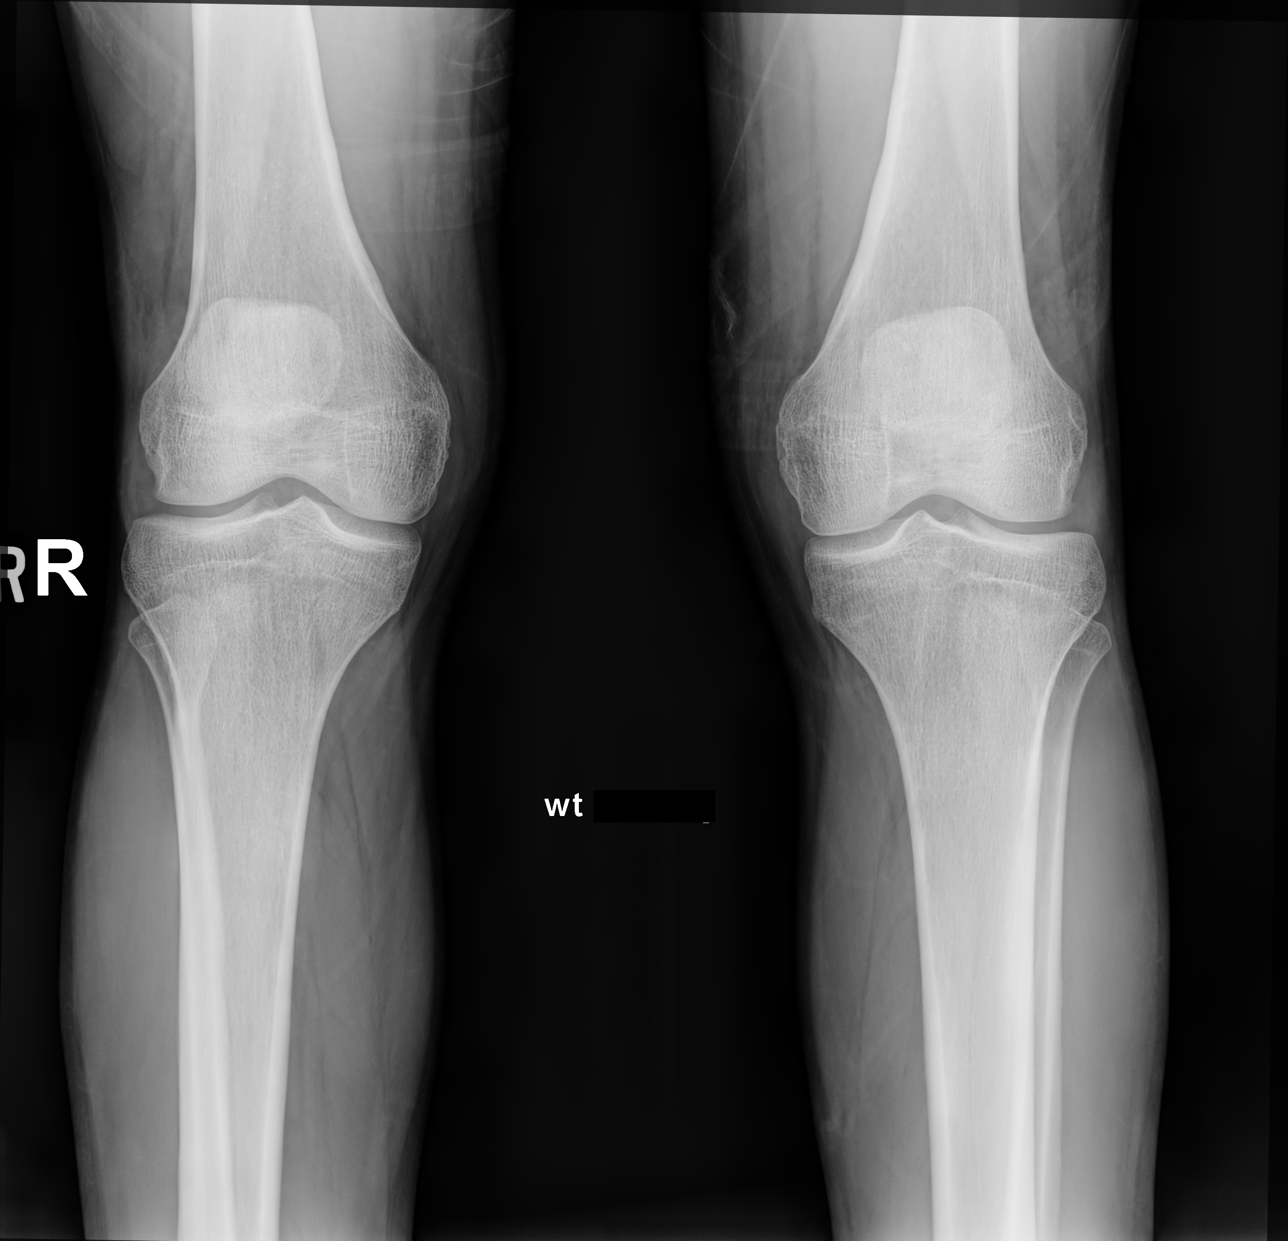

[knee [person_name] view pa]
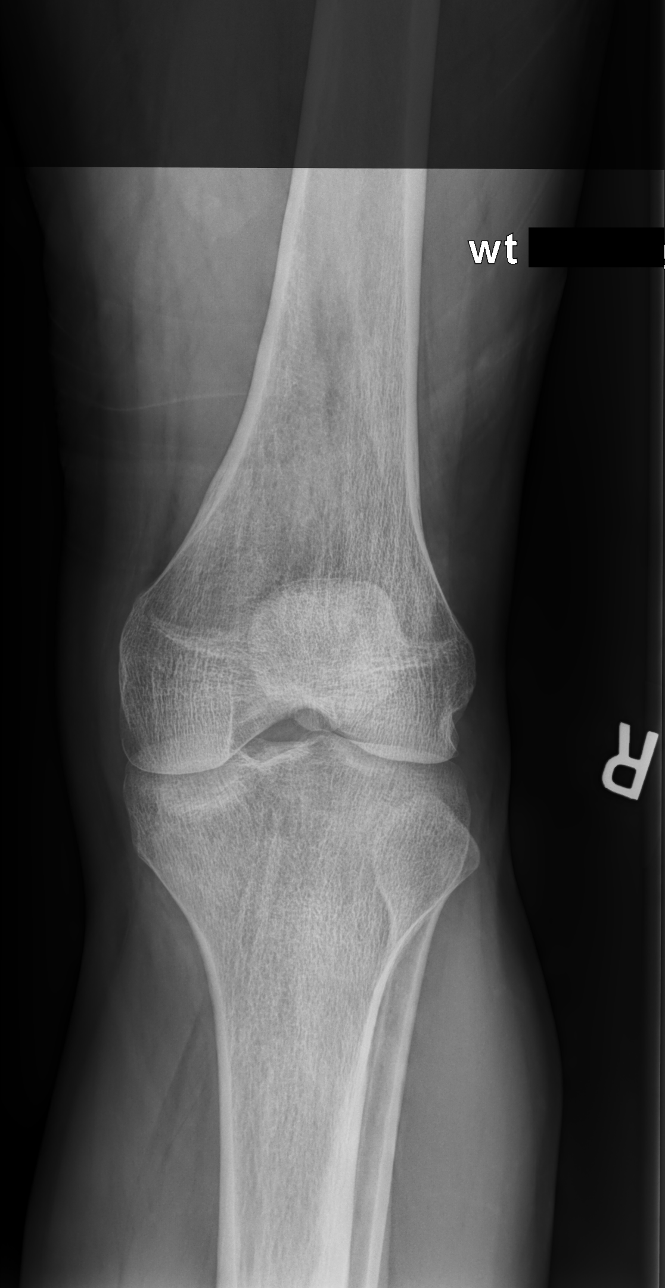

[knee lat]
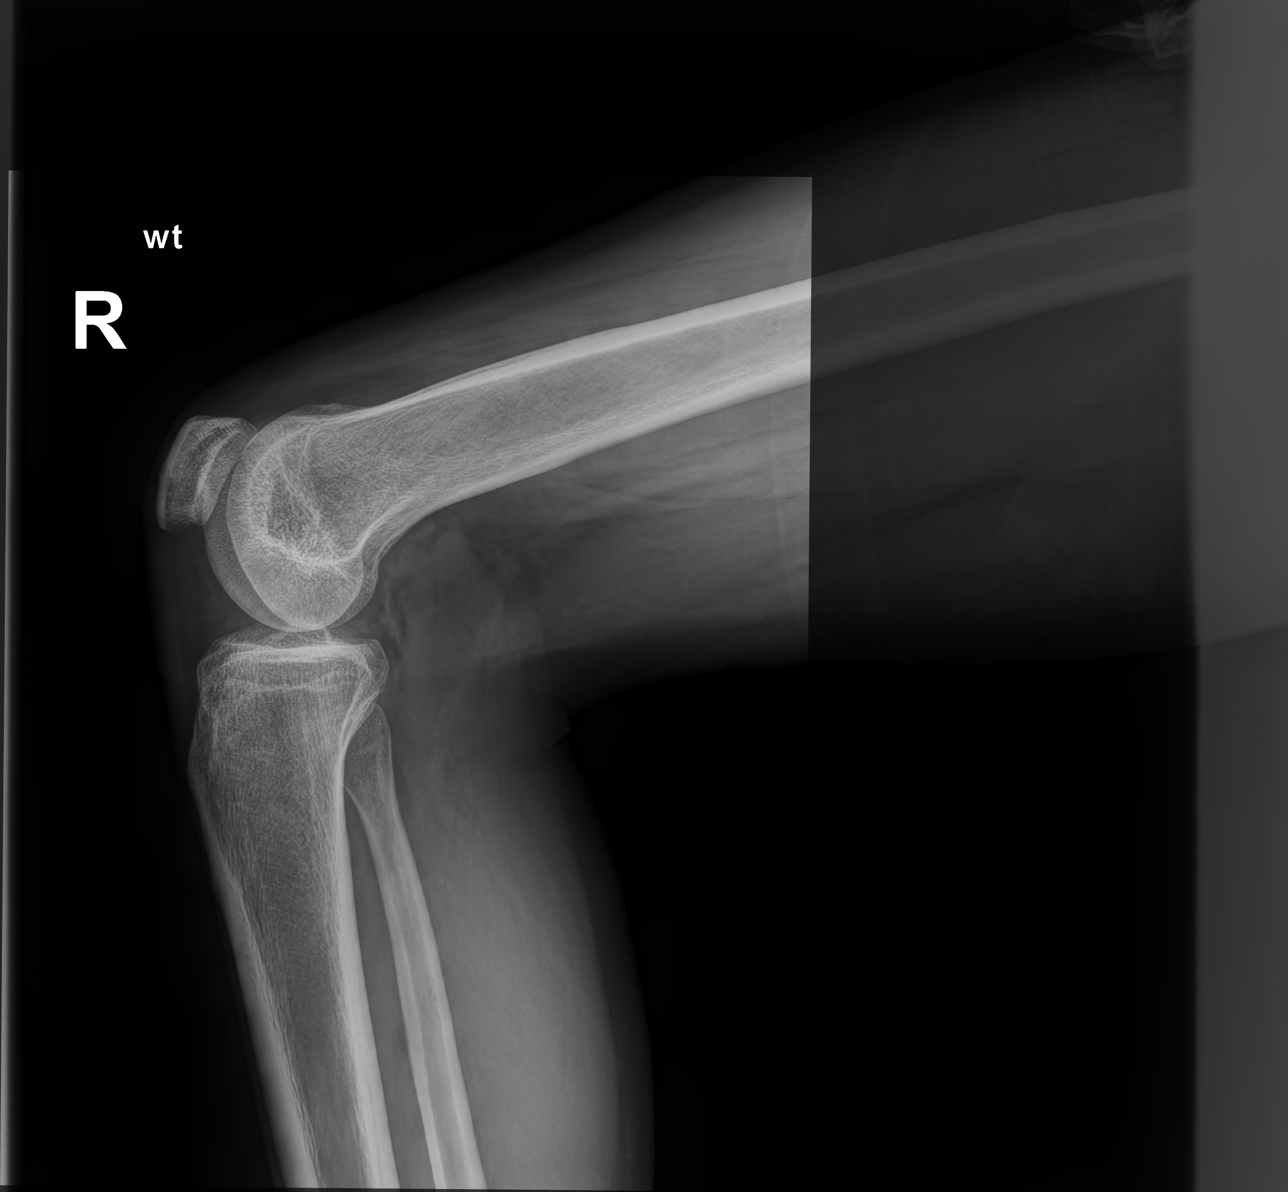

[patella (sunrise)]
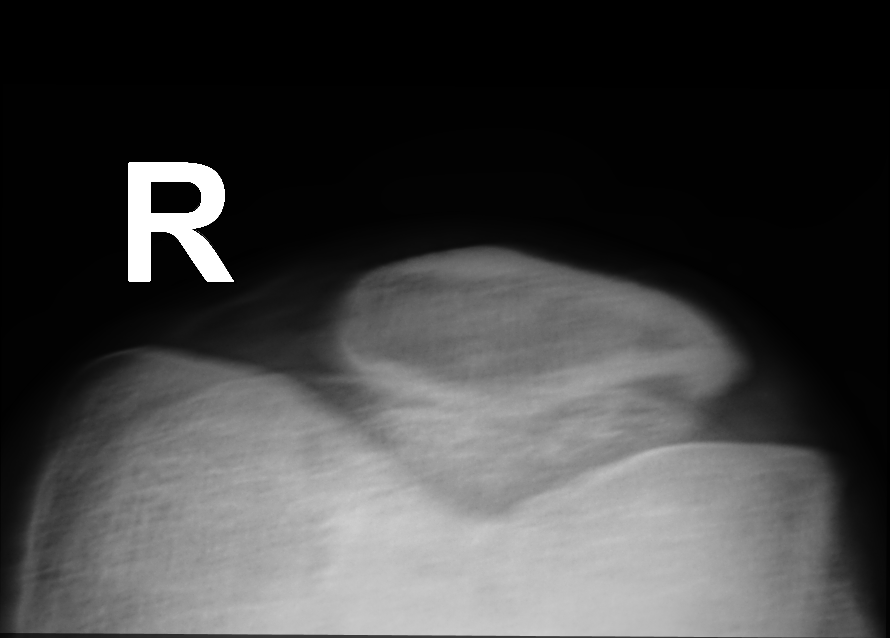

[4 of 4 positions shown; findings below may reference images not displayed]

FINDINGS: No evidence of fracture, dislocation, or joint effusion. No evidence
of arthropathy or other focal bone abnormality. Soft tissues are
unremarkable.
IMPRESSION: Negative.

## 2023-10-27 ENCOUNTER — Encounter: Payer: BLUE CROSS/BLUE SHIELD | Admitting: Nurse Practitioner

## 2023-11-17 ENCOUNTER — Ambulatory Visit: Payer: Self-pay | Admitting: Nurse Practitioner

## 2023-11-17 ENCOUNTER — Encounter: Payer: Self-pay | Admitting: Nurse Practitioner

## 2023-11-17 ENCOUNTER — Other Ambulatory Visit (HOSPITAL_COMMUNITY)
Admission: RE | Admit: 2023-11-17 | Discharge: 2023-11-17 | Disposition: A | Discharge status: Home / Self Care | Source: Ambulatory Visit | Attending: Nurse Practitioner | Admitting: Nurse Practitioner

## 2023-11-17 ENCOUNTER — Telehealth: Payer: Self-pay

## 2023-11-17 VITALS — BP 160/104 | HR 82 | Temp 97.8°F | Ht 66.0 in | Wt 157.2 lb

## 2023-11-17 DIAGNOSIS — R7301 Impaired fasting glucose: Secondary | ICD-10-CM

## 2023-11-17 DIAGNOSIS — G4733 Obstructive sleep apnea (adult) (pediatric): Secondary | ICD-10-CM | POA: Diagnosis not present

## 2023-11-17 DIAGNOSIS — R21 Rash and other nonspecific skin eruption: Secondary | ICD-10-CM | POA: Diagnosis not present

## 2023-11-17 DIAGNOSIS — Z113 Encounter for screening for infections with a predominantly sexual mode of transmission: Secondary | ICD-10-CM | POA: Insufficient documentation

## 2023-11-17 DIAGNOSIS — R7989 Other specified abnormal findings of blood chemistry: Secondary | ICD-10-CM | POA: Diagnosis not present

## 2023-11-17 DIAGNOSIS — I10 Essential (primary) hypertension: Secondary | ICD-10-CM

## 2023-11-17 DIAGNOSIS — D72819 Decreased white blood cell count, unspecified: Secondary | ICD-10-CM

## 2023-11-17 DIAGNOSIS — Z0001 Encounter for general adult medical examination with abnormal findings: Secondary | ICD-10-CM | POA: Diagnosis not present

## 2023-11-17 DIAGNOSIS — Z0000 Encounter for general adult medical examination without abnormal findings: Secondary | ICD-10-CM | POA: Insufficient documentation

## 2023-11-17 LAB — COMPREHENSIVE METABOLIC PANEL WITH GFR
ALT: 47 U/L (ref 0–53)
AST: 37 U/L (ref 0–37)
Albumin: 4.9 g/dL (ref 3.5–5.2)
Alkaline Phosphatase: 73 U/L (ref 39–117)
BUN: 8 mg/dL (ref 6–23)
CO2: 29 meq/L (ref 19–32)
Calcium: 9.8 mg/dL (ref 8.4–10.5)
Chloride: 100 meq/L (ref 96–112)
Creatinine, Ser: 0.59 mg/dL (ref 0.40–1.50)
GFR: 126.2 mL/min (ref >60.00)
Glucose, Bld: 128 mg/dL — ABNORMAL HIGH (ref 70–99)
Potassium: 3.7 meq/L (ref 3.5–5.1)
Sodium: 139 meq/L (ref 135–145)
Total Bilirubin: 0.9 mg/dL (ref 0.2–1.2)
Total Protein: 7.2 g/dL (ref 6.0–8.3)

## 2023-11-17 LAB — CBC WITH DIFFERENTIAL/PLATELET
Basophils Absolute: 0.1 10*3/uL (ref 0.0–0.1)
Basophils Relative: 3.2 % — ABNORMAL HIGH (ref 0.0–3.0)
Eosinophils Absolute: 0 10*3/uL (ref 0.0–0.7)
Eosinophils Relative: 2.4 % (ref 0.0–5.0)
HCT: 40.5 % (ref 39.0–52.0)
Hemoglobin: 13.6 g/dL (ref 13.0–17.0)
Lymphocytes Relative: 52.6 % — ABNORMAL HIGH (ref 12.0–46.0)
Lymphs Abs: 1 10*3/uL (ref 0.7–4.0)
MCHC: 33.5 g/dL (ref 30.0–36.0)
MCV: 98 fl (ref 78.0–100.0)
Monocytes Absolute: 0.2 10*3/uL (ref 0.1–1.0)
Monocytes Relative: 13 % — ABNORMAL HIGH (ref 3.0–12.0)
Neutro Abs: 0.5 10*3/uL — ABNORMAL LOW (ref 1.4–7.7)
Neutrophils Relative %: 28.8 % — ABNORMAL LOW (ref 43.0–77.0)
Platelets: 250 10*3/uL (ref 150.0–400.0)
RBC: 4.13 Mil/uL — ABNORMAL LOW (ref 4.22–5.81)
RDW: 12.9 % (ref 11.5–15.5)
WBC: 1.8 10*3/uL — CL (ref 4.0–10.5)

## 2023-11-17 LAB — TSH: TSH: 1.24 u[IU]/mL (ref 0.35–5.50)

## 2023-11-17 LAB — LIPID PANEL
Cholesterol: 248 mg/dL — ABNORMAL HIGH (ref 0–200)
HDL: 88.1 mg/dL (ref >39.00)
LDL Cholesterol: 140 mg/dL — ABNORMAL HIGH (ref 0–99)
NonHDL: 159.73
Total CHOL/HDL Ratio: 3
Triglycerides: 97 mg/dL (ref 0.0–149.0)
VLDL: 19.4 mg/dL (ref 0.0–40.0)

## 2023-11-17 LAB — FERRITIN: Ferritin: 278.8 ng/mL (ref 22.0–322.0)

## 2023-11-17 LAB — HEMOGLOBIN A1C: Hgb A1c MFr Bld: 4.9 % (ref 4.6–6.5)

## 2023-11-17 MED ORDER — AMLODIPINE BESYLATE 5 MG PO TABS: 5.0000 mg | ORAL_TABLET | Freq: Every day | ORAL | 3 refills | Status: AC

## 2023-11-17 MED ORDER — TRIAMCINOLONE ACETONIDE 0.1 % EX CREA
1.0000 | TOPICAL_CREAM | Freq: Two times a day (BID) | CUTANEOUS | 0 refills | Status: AC
Start: 2023-11-17 — End: ?

## 2023-11-17 NOTE — Assessment & Plan Note (Signed)
 Scaly rash to base of left middle finger. Start triamcinolone cream BID.

## 2023-11-17 NOTE — Telephone Encounter (Signed)
 I called and spoke with patient and notified him of critical lab results and that a referral was placed and that someone will be in contact with him to get him an appointment scheduled.

## 2023-11-17 NOTE — Assessment & Plan Note (Signed)
 Chronic, ongoing. He has been out of his blood pressure medication. Will refill amlodipine 5mg  daily. His potassium has been low in the past, will check renin/aldosterone in addition to CMP, CBC, lipid panel. Follow-up in 6 months.

## 2023-11-17 NOTE — Assessment & Plan Note (Signed)
Health maintenance reviewed and updated. Discussed nutrition, exercise. Follow-up 1 year.

## 2023-11-17 NOTE — Patient Instructions (Signed)
 It was great to see you!  We are checking your labs today and will let you know the results via mychart/phone.   I have refilled your blood pressure medication  Start triamcinolone cream twice a day on your finger  Let's follow-up in 6 months, sooner if you have concerns.  If a referral was placed today, you will be contacted for an appointment. Please note that routine referrals can sometimes take up to 3-4 weeks to process. Please call our office if you haven't heard anything after this time frame.  Take care,  Rheba Cedar, NP

## 2023-11-17 NOTE — Telephone Encounter (Signed)
 Received a call from Mariah Shines (Designer, industrial/product) with critical lab results for WBC 1.8 low  Last results were done on 11/02/2022 2.3   Spoke to Rheba Cedar NP and verbal results were given.

## 2023-11-17 NOTE — Assessment & Plan Note (Signed)
 Check CMP today. He has been drinking 2 drinks/day. If still elevated, will get liver ultrasound.

## 2023-11-17 NOTE — Addendum Note (Signed)
 Addended by: Treg Diemer A on: 11/17/2023 02:01 PM   Modules accepted: Orders

## 2023-11-17 NOTE — Assessment & Plan Note (Signed)
 Chronic, not controlled. He would like to go back on his CPAP. Referral placed to [facility] where he was treated before.

## 2023-11-17 NOTE — Progress Notes (Signed)
 BP (!) 160/104 (BP Location: Right Arm, Cuff Size: Normal)   Pulse 82   Temp 97.8 F (36.6 C)   Ht 5\' 6"  (1.676 m)   Wt 157 lb 3.2 oz (71.3 kg)   SpO2 97%   BMI 25.37 kg/m    Subjective:    Patient ID: Andre Pierce., male    DOB: 1991-01-02, 33 y.o.   MRN: [id number]  CC: Chief Complaint  Patient presents with   Annual Exam    With fasting lab work, Rx Refill, no concerns    HPI: Andre Pierce. is a 33 y.o. male presenting on 11/17/2023 for comprehensive medical examination. Current medical complaints include:none  He currently lives with: wife, children  Depression and Anxiety Screen done today and results listed below:     11/17/2023    8:52 AM 11/02/2022    8:56 AM 10/08/2021    4:00 PM  Depression screen PHQ 2/9  Decreased Interest 3 1 3   Down, Depressed, Hopeless 0 0 0  PHQ - 2 Score 3 1 3   Altered sleeping 0 0 1  Tired, decreased energy 1 1 1   Change in appetite 0 0 0  Feeling bad or failure about yourself  0 0 0  Trouble concentrating 0 0 0  Moving slowly or fidgety/restless 0 0 0  Suicidal thoughts 0 0 0  PHQ-9 Score 4 2 5   Difficult doing work/chores  Not difficult at all Not difficult at all      11/17/2023    8:53 AM 11/02/2022    8:57 AM 10/08/2021    4:00 PM  GAD 7 : Generalized Anxiety Score  Nervous, Anxious, on Edge 0 0 0  Control/stop worrying 0 0 0  Worry too much - different things 0 0 0  Trouble relaxing 0 0 0  Restless 0 0 0  Easily annoyed or irritable 0 0 0  Afraid - awful might happen 0 0 0  Total GAD 7 Score 0 0 0  Anxiety Difficulty Not difficult at all Not difficult at all Not difficult at all    The patient does not have a history of falls. I did not complete a risk assessment for falls. A plan of care for falls was not documented.   Past Medical History:  Past Medical History:  Diagnosis Date   Eczema    Hemorrhoid    Hypertension     Surgical History:  Past Surgical History:  Procedure Laterality Date   CYST  EXCISION Right    wrist   VASECTOMY      Medications:  Current Outpatient Medications on File Prior to Visit  Medication Sig   naproxen (NAPROSYN) 500 MG tablet Take 1 tablet (500 mg total) by mouth 2 (two) times daily with a meal. (Patient not taking: Reported on 11/17/2023)   No current facility-administered medications on file prior to visit.    Allergies:  No Known Allergies  Social History:  Social History   Socioeconomic History   Marital status: Single    Spouse name: Not on file   Number of children: Not on file   Years of education: Not on file   Highest education level: Not on file  Occupational History   Not on file  Tobacco Use   Smoking status: Former    Current packs/day: 0.00    Types: Cigarettes    Quit date: 2018    Years since quitting: 7.2   Smokeless tobacco: Never  Vaping Use  Vaping status: Never Used  Substance and Sexual Activity   Alcohol use: Yes    Alcohol/week: 14.0 standard drinks of alcohol    Types: 14 Shots of liquor per week    Comment: take 1-2 daily shots   Drug use: No   Sexual activity: Yes    Birth control/protection: None  Other Topics Concern   Not on file  Social History Narrative   Caffeine rare.    Education 10 th   [employer]     Social Drivers of Corporate investment banker Strain: Not on file  Food Insecurity: Not on file  Transportation Needs: Not on file  Physical Activity: Not on file  Stress: Not on file  Social Connections: Not on file  Intimate Partner Violence: Not on file   Social History   Tobacco Use  Smoking Status Former   Current packs/day: 0.00   Types: Cigarettes   Quit date: 2018   Years since quitting: 7.2  Smokeless Tobacco Never   Social History   Substance and Sexual Activity  Alcohol Use Yes   Alcohol/week: 14.0 standard drinks of alcohol   Types: 14 Shots of liquor per week   Comment: take 1-2 daily shots    Family History:  Family History  Problem Relation Age of  Onset   Healthy Mother    Healthy Father    Dementia Maternal Grandmother    Cancer Paternal Grandmother     Past medical history, surgical history, medications, allergies, family history and social history reviewed with patient today and changes made to appropriate areas of the chart.   Review of Systems  Constitutional: Negative.   HENT: Negative.    Eyes: Negative.   Respiratory: Negative.    Cardiovascular: Negative.   Gastrointestinal: Negative.   Genitourinary: Negative.   Musculoskeletal: Negative.   Skin:  Positive for rash (base of middle finger on left hand).  Neurological: Negative.   Psychiatric/Behavioral: Negative.     All other ROS negative except what is listed above and in the HPI.      Objective:    BP (!) 160/104 (BP Location: Right Arm, Cuff Size: Normal)   Pulse 82   Temp 97.8 F (36.6 C)   Ht 5\' 6"  (1.676 m)   Wt 157 lb 3.2 oz (71.3 kg)   SpO2 97%   BMI 25.37 kg/m   Wt Readings from Last 3 Encounters:  11/17/23 157 lb 3.2 oz (71.3 kg)  06/25/23 153 lb (69.4 kg)  02/22/23 156 lb 6.4 oz (70.9 kg)    Physical Exam Vitals and nursing note reviewed.  Constitutional:      General: He is not in acute distress.    Appearance: Normal appearance.  HENT:     Head: Normocephalic and atraumatic.     Right Ear: Tympanic membrane, ear canal and external ear normal.     Left Ear: Tympanic membrane, ear canal and external ear normal.     Mouth/Throat:     Mouth: Mucous membranes are moist.     Pharynx: No posterior oropharyngeal erythema.  Eyes:     Conjunctiva/sclera: Conjunctivae normal.  Cardiovascular:     Rate and Rhythm: Normal rate and regular rhythm.     Pulses: Normal pulses.     Heart sounds: Normal heart sounds.  Pulmonary:     Effort: Pulmonary effort is normal.     Breath sounds: Normal breath sounds.  Abdominal:     Palpations: Abdomen is soft.     Tenderness:  There is no abdominal tenderness.  Musculoskeletal:        General:  Normal range of motion.     Cervical back: Normal range of motion and neck supple. No tenderness.     Right lower leg: No edema.     Left lower leg: No edema.  Lymphadenopathy:     Cervical: No cervical adenopathy.  Skin:    General: Skin is warm and dry.     Findings: Rash (base middle finger left hand) present.  Neurological:     General: No focal deficit present.     Mental Status: He is alert and oriented to person, place, and time.     Cranial Nerves: No cranial nerve deficit.     Coordination: Coordination normal.     Gait: Gait normal.  Psychiatric:        Mood and Affect: Mood normal.        Behavior: Behavior normal.        Thought Content: Thought content normal.        Judgment: Judgment normal.     Results for orders placed or performed in visit on 02/22/23  POCT Influenza A/B   Collection Time: 02/22/23  1:46 PM  Result Value Ref Range   Influenza A, POC Negative Negative   Influenza B, POC Negative Negative  POCT rapid strep A   Collection Time: 02/22/23  1:47 PM  Result Value Ref Range   Rapid Strep A Screen Negative Negative  POC COVID-19 BinaxNow   Collection Time: 02/22/23  1:47 PM  Result Value Ref Range   SARS Coronavirus 2 Ag Negative Negative      Assessment & Plan:   Problem List Items Addressed This Visit       Cardiovascular and Mediastinum   Primary hypertension   Chronic, ongoing. He has been out of his blood pressure medication. Will refill amlodipine 5mg  daily. His potassium has been low in the past, will check renin/aldosterone in addition to CMP, CBC, lipid panel. Follow-up in 6 months.       Relevant Medications   amLODipine (NORVASC) 5 MG tablet   Other Relevant Orders   CBC with Differential/Platelet   Comprehensive metabolic panel with GFR   Aldosterone + renin activity w/ ratio   Lipid panel     Respiratory   OSA (obstructive sleep apnea)   Chronic, not controlled. He would like to go back on his CPAP. Referral placed to  [facility] where he was treated before.       Relevant Orders   Ambulatory referral to Sleep Studies     Musculoskeletal and Integument   Rash   Scaly rash to base of left middle finger. Start triamcinolone cream BID.         Other   Elevated LFTs   Check CMP today. He has been drinking 2 drinks/day. If still elevated, will get liver ultrasound.       Relevant Orders   Comprehensive metabolic panel with GFR   Ferritin   TSH   Routine general medical examination at a health care facility - Primary   Health maintenance reviewed and updated. Discussed nutrition, exercise. Follow-up 1 year.        Other Visit Diagnoses       Screen for STD (sexually transmitted disease)       Screen STDs, no current symptoms   Relevant Orders   HIV Antibody (routine testing w rflx)   Urine cytology ancillary only  IFG (impaired fasting glucose)       Check A1c today   Relevant Orders   Hemoglobin A1c       IMMUNIZATIONS:   - Tdap: Tetanus vaccination status reviewed: last tetanus booster within 10 years. - Influenza: Postponed to flu season - Pneumovax: Not applicable - Prevnar: Not applicable - HPV: Not applicable - Shingrix vaccine: Not applicable  SCREENING: - Colonoscopy: Not applicable  Discussed with patient purpose of the colonoscopy is to detect colon cancer at curable precancerous or early stages   - AAA Screening: Not applicable   PATIENT COUNSELING:    Sexuality: Discussed sexually transmitted diseases, partner selection, use of condoms, avoidance of unintended pregnancy  and contraceptive alternatives.   Advised to avoid cigarette smoking.  I discussed with the patient that most people either abstain from alcohol or drink within safe limits (<=14/week and <=4 drinks/occasion for males, <=7/weeks and <= 3 drinks/occasion for females) and that the risk for alcohol disorders and other health effects rises proportionally with the number of  drinks per week and how often a drinker exceeds daily limits.  Discussed cessation/primary prevention of drug use and availability of treatment for abuse.   Diet: Encouraged to adjust caloric intake to maintain  or achieve ideal body weight, to reduce intake of dietary saturated fat and total fat, to limit sodium intake by avoiding high sodium foods and not adding table salt, and to maintain adequate dietary potassium and calcium preferably from fresh fruits, vegetables, and low-fat dairy products.    stressed the importance of regular exercise  Injury prevention: Discussed safety belts, safety helmets, smoke detector, smoking near bedding or upholstery.   Dental health: Discussed importance of regular tooth brushing, flossing, and dental visits.   Follow up plan: NEXT PREVENTATIVE PHYSICAL DUE IN 1 YEAR. Return in about 6 months (around 05/18/2024) for HTN.  Mal Asher A Darelle Kings

## 2023-11-18 ENCOUNTER — Ambulatory Visit

## 2023-11-18 DIAGNOSIS — Z0000 Encounter for general adult medical examination without abnormal findings: Secondary | ICD-10-CM

## 2023-11-18 DIAGNOSIS — Z113 Encounter for screening for infections with a predominantly sexual mode of transmission: Secondary | ICD-10-CM

## 2023-11-18 LAB — URINE CYTOLOGY ANCILLARY ONLY
Chlamydia: NEGATIVE
Comment: NEGATIVE
Comment: NORMAL
Neisseria Gonorrhea: NEGATIVE

## 2023-11-18 LAB — TIQ-NTM

## 2023-11-18 NOTE — Addendum Note (Signed)
 Addended by: Genevive Ket D on: 11/18/2023 03:56 PM   Modules accepted: Orders

## 2023-11-19 LAB — HIV ANTIBODY (ROUTINE TESTING W REFLEX): HIV 1&2 Ab, 4th Generation: NONREACTIVE

## 2023-11-21 LAB — ALDOSTERONE + RENIN ACTIVITY W/ RATIO
ALDO / PRA Ratio: 0.9 ratio (ref 0.9–28.9)
Aldosterone: 1 ng/dL
Renin Activity: 1.07 ng/mL/h (ref 0.25–5.82)

## 2023-11-21 LAB — HIV ANTIBODY (ROUTINE TESTING W REFLEX)

## 2023-11-22 ENCOUNTER — Telehealth: Payer: Self-pay

## 2023-11-22 NOTE — Telephone Encounter (Signed)
 Forwarding message below

## 2023-11-22 NOTE — Telephone Encounter (Signed)
 I spoke with Andre Pierce in our Lab and Mariah Shines had sent labs to [facility] and were resulted. Patient questions why his WBC is lower?

## 2023-11-22 NOTE — Telephone Encounter (Signed)
 I called and spoke with patient and notified him of below message and also gave him additional lab results.

## 2023-11-22 NOTE — Telephone Encounter (Signed)
 Copied from CRM [id number]. Topic: Clinical - Lab/Test Results >> Nov 22, 2023  8:23 AM Bambi Bonine D wrote: Reason for CRM: Patient is calling regarding his lab results on 4/16. Patient stated that he received a call from [place] stating that his blood results were frozen. Patient would like for someone to give him a call back regarding this concern. Patient would also like someone to explain the white blood cell count and why it is lower.

## 2023-11-30 NOTE — Progress Notes (Deleted)
 [facility] [phone]   [phone]  CONSULT NOTE  REFERRING PHYSICIAN: Rheba Cedar NP  REASON FOR CONSULTATION:  Neutropenia  HPI Andre Pierce. is a 33 y.o. male with medical history significant for sleep apnea, hypertension, and ***referred to the clinic for neutropenia.  The patient was referred to the clinic by his PCP.  The patient had an annual exam on 11/17/2023.  Labs showed worsening leukopenia and he was referred to the clinic regarding this.  Regarding the leukopenia, this has been going on for approximately *** months to the patients knowledge. The most recent labs prior to 2024.  The patient had leukopenia last year on 11/02/2018 for which the total white blood cell count was low at 2.3 and ANC was low at 0.6.  Platelet count and hemoglobin were normal.  The patient's most recent labs were from 11/17/2023 at which the total white blood cell count was low at 1.8 and ANC was low at 0.5.  Patient was referred to the clinic regarding these findings.   The patient denies frequent illness.  Regarding fatigue, ***.  He denies personal history of autoimmune disorder or inflammatory disorder. He denies abdominal bloating or early satiety.   The patient denies any known liver disease such as hepatitis, NAFLD, or cirrhosis. For alcohol use, he estimates he drinks approximately *** drinks per week . His drink of choice is ***.  He denies unexplained weight loss.  He denies any known nutritional deficiencies or dietary restrictions.  He denies history of bariatric surgery. For medication, Andre Pierce takes ***.  He denies any herbal supplements. He denies abnormal bleeding, bruising, or bleeding complications from surgery. Denies any history of HIV. Hep C Ab was non-reactive. This was checked recently at his PCP's office which was negative. Denies any known tick borne illness. Denies fevers, chills, night sweats, or unexplained weight loss. Denies  lymphadenopathy.   HPI  Past Medical History:  Diagnosis Date   Eczema    Hemorrhoid    Hypertension     Past Surgical History:  Procedure Laterality Date   CYST EXCISION Right    wrist   VASECTOMY      Family History  Problem Relation Age of Onset   Healthy Mother    Healthy Father    Dementia Maternal Grandmother    Cancer Paternal Grandmother     Social History Social History   Tobacco Use   Smoking status: Former    Current packs/day: 0.00    Types: Cigarettes    Quit date: 2018    Years since quitting: 7.3   Smokeless tobacco: Never  Vaping Use   Vaping status: Never Used  Substance Use Topics   Alcohol use: Yes    Alcohol/week: 14.0 standard drinks of alcohol    Types: 14 Shots of liquor per week    Comment: take 1-2 daily shots   Drug use: No    No Known Allergies  Current Outpatient Medications  Medication Sig Dispense Refill   amLODipine  (NORVASC ) 5 MG tablet Take 1 tablet (5 mg total) by mouth daily. 90 tablet 3   naproxen  (NAPROSYN ) 500 MG tablet Take 1 tablet (500 mg total) by mouth 2 (two) times daily with a meal. (Patient not taking: Reported on 11/17/2023) 180 tablet 1   triamcinolone  cream (KENALOG ) 0.1 % Apply 1 Application topically 2 (two) times daily. 30 g 0   No current facility-administered medications for this visit.    REVIEW OF SYSTEMS:  Review of Systems  Constitutional: Negative for appetite change, chills, fatigue, fever and unexpected weight change.  HENT:   Negative for mouth sores, nosebleeds, sore throat and trouble swallowing.   Eyes: Negative for eye problems and icterus.  Respiratory: Negative for cough, hemoptysis, shortness of breath and wheezing.   Cardiovascular: Negative for chest pain and leg swelling.  Gastrointestinal: Negative for abdominal pain, constipation, diarrhea, nausea and vomiting.  Genitourinary: Negative for bladder incontinence, difficulty urinating, dysuria, frequency and hematuria.    Musculoskeletal: Negative for back pain, gait problem, neck pain and neck stiffness.  Skin: Negative for itching and rash.  Neurological: Negative for dizziness, extremity weakness, gait problem, headaches, light-headedness and seizures.  Hematological: Negative for adenopathy. Does not bruise/bleed easily.  Psychiatric/Behavioral: Negative for confusion, depression and sleep disturbance. The patient is not nervous/anxious.     PHYSICAL EXAMINATION:  There were no vitals taken for this visit.  ECOG PERFORMANCE STATUS: {CHL ONC ECOG [id number]  Physical Exam  Constitutional: Oriented to person, place, and time and well-developed, well-nourished, and in no distress. No distress.  HENT:  Head: Normocephalic and atraumatic.  Mouth/Throat: Oropharynx is clear and moist. No oropharyngeal exudate.  Eyes: Conjunctivae are normal. Right eye exhibits no discharge. Left eye exhibits no discharge. No scleral icterus.  Neck: Normal range of motion. Neck supple.  Cardiovascular: Normal rate, regular rhythm, normal heart sounds and intact distal pulses.   Pulmonary/Chest: Effort normal and breath sounds normal. No respiratory distress. No wheezes. No rales.  Abdominal: Soft. Bowel sounds are normal. Exhibits no distension and no mass. There is no tenderness.  Musculoskeletal: Normal range of motion. Exhibits no edema.  Lymphadenopathy:    No cervical adenopathy.  Neurological: Alert and oriented to person, place, and time. Exhibits normal muscle tone. Gait normal. Coordination normal.  Skin: Skin is warm and dry. No rash noted. Not diaphoretic. No erythema. No pallor.  Psychiatric: Mood, memory and judgment normal.  Vitals reviewed.  LABORATORY DATA: Lab Results  Component Value Date   WBC 1.8 Repeated and verified X2. (LL) 11/17/2023   HGB 13.6 11/17/2023   HCT 40.5 11/17/2023   MCV 98.0 11/17/2023   PLT 250.0 11/17/2023      Chemistry      Component Value Date/Time   NA 139  11/17/2023 0909   K 3.7 11/17/2023 0909   CL 100 11/17/2023 0909   CO2 29 11/17/2023 0909   BUN 8 11/17/2023 0909   CREATININE 0.59 11/17/2023 0909      Component Value Date/Time   CALCIUM 9.8 11/17/2023 0909   ALKPHOS 73 11/17/2023 0909   AST 37 11/17/2023 0909   ALT 47 11/17/2023 0909   BILITOT 0.9 11/17/2023 0909       RADIOGRAPHIC STUDIES: No results found.  ASSESSMENT: This very pleasant 33 year old African-American male referred to the clinic for leukopenia and neutropenia  The patient was seen with Dr. Rosaline Coma today.    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    The differential for neutropenia includes nutritional deficiency, inflammatory disorder, medication side effect, infectious etiology, or congenital condition (such as benign ethnic neutropenia). The patient is *** not taking any medications known to cause neutropenia. Workup for this condition includes viral serologies (HIV, Hep B and Hep C),  vitamin b12/folate, and inflammatory markers with ESR and CRP.     A common cause for low WBC is benign ethnic neutropenia (  BEN). This is a condition whereby individuals of African or Mediterranean descent have lower WBC than the general population. The condition typically has an absolute neutrophil count (ANC) between 1000-1500 with no recurrent infections. The patients with this condition have normal functioning immune systems and have no consequences as a result of their low ANC. BEN is a diagnosis of exclusion, so it would require the full above workup to make.      #Leukopenia,Unclear Etiology  --The patient was seen with Dr. Rosaline Coma --Workup for this condition includes:  --repeat CBC and CMP today --infectious serology testing with Hep B, Hep C. --The patient recently had HIV testing which was negative  --nutritional evaluation with Vitamin b12,  methylmalonic acid, and folate  --inflammatory workup with ESR and CRP  --I will call him with the results early next week once I have everything resulted --If not clear findings, Dr. Rosaline Coma feels this could be secondary to *** --Will determine follow up instructions based on pending labs from today.   The patient voices understanding of current disease status and treatment options and is in agreement with the current care plan.  All questions were answered. The patient knows to call the clinic with any problems, questions or concerns. We can certainly see the patient much sooner if necessary.  Thank you so much for allowing me to participate in the care of Andre Pierce.. I will continue to follow up the patient with you and assist in his care.  I spent {CHL ONC TIME VISIT - UEAVW:[id number]} counseling the patient face to face. The total time spent in the appointment was {CHL ONC TIME VISIT - GNFAO:[id number]}.  Disclaimer: This note was dictated with voice recognition software. Similar sounding words can inadvertently be transcribed and may not be corrected upon review.   Andre Pierce L Andre Pierce November 30, 2023, 5:07 PM

## 2023-12-02 ENCOUNTER — Other Ambulatory Visit: Payer: Self-pay | Admitting: Physician Assistant

## 2023-12-02 DIAGNOSIS — D72819 Decreased white blood cell count, unspecified: Secondary | ICD-10-CM

## 2023-12-03 ENCOUNTER — Inpatient Hospital Stay

## 2023-12-03 ENCOUNTER — Inpatient Hospital Stay: Admitting: Physician Assistant

## 2023-12-14 NOTE — Progress Notes (Deleted)
 [facility] [phone]   [phone]  CONSULT NOTE  REFERRING PHYSICIAN: Rheba Cedar NP  REASON FOR CONSULTATION:  Neutropenia  HPI Andre Merker. is a 33 y.o. male with medical history significant for sleep apnea, hypertension, and ***referred to the clinic for neutropenia.  The patient was referred to the clinic by his PCP.  The patient had an annual exam on 11/17/2023.  Labs showed worsening leukopenia and he was referred to the clinic regarding this.  Regarding the leukopenia, this has been going on for approximately *** months to the patients knowledge. The most recent labs prior to 2024.  The patient had leukopenia last year on 11/02/2018 for which the total white blood cell count was low at 2.3 and ANC was low at 0.6.  Platelet count and hemoglobin were normal.  The patient's most recent labs were from 11/17/2023 at which the total white blood cell count was low at 1.8 and ANC was low at 0.5.  Patient was referred to the clinic regarding these findings.   The patient denies frequent illness.  Regarding fatigue, ***.  He denies personal history of autoimmune disorder or inflammatory disorder. He denies abdominal bloating or early satiety.   The patient denies any known liver disease such as hepatitis, NAFLD, or cirrhosis. For alcohol use, he estimates he drinks approximately *** drinks per week . His drink of choice is ***.  He denies unexplained weight loss.  He denies any known nutritional deficiencies or dietary restrictions.  He denies history of bariatric surgery. For medication, Andre Pierce takes ***.  He denies any herbal supplements. He denies abnormal bleeding, bruising, or bleeding complications from surgery. Denies any history of HIV. Hep C Ab was non-reactive. This was checked recently at his PCP's office which was negative. Denies any known tick borne illness. Denies fevers, chills, night sweats, or unexplained weight loss. Denies  lymphadenopathy.   HPI  Past Medical History:  Diagnosis Date   Eczema    Hemorrhoid    Hypertension     Past Surgical History:  Procedure Laterality Date   CYST EXCISION Right    wrist   VASECTOMY      Family History  Problem Relation Age of Onset   Healthy Mother    Healthy Father    Dementia Maternal Grandmother    Cancer Paternal Grandmother     Social History Social History   Tobacco Use   Smoking status: Former    Current packs/day: 0.00    Types: Cigarettes    Quit date: 2018    Years since quitting: 7.3   Smokeless tobacco: Never  Vaping Use   Vaping status: Never Used  Substance Use Topics   Alcohol use: Yes    Alcohol/week: 14.0 standard drinks of alcohol    Types: 14 Shots of liquor per week    Comment: take 1-2 daily shots   Drug use: No    No Known Allergies  Current Outpatient Medications  Medication Sig Dispense Refill   amLODipine  (NORVASC ) 5 MG tablet Take 1 tablet (5 mg total) by mouth daily. 90 tablet 3   naproxen  (NAPROSYN ) 500 MG tablet Take 1 tablet (500 mg total) by mouth 2 (two) times daily with a meal. (Patient not taking: Reported on 11/17/2023) 180 tablet 1   triamcinolone  cream (KENALOG ) 0.1 % Apply 1 Application topically 2 (two) times daily. 30 g 0   No current facility-administered medications for this visit.    REVIEW OF SYSTEMS:  Review of Systems  Constitutional: Negative for appetite change, chills, fatigue, fever and unexpected weight change.  HENT:   Negative for mouth sores, nosebleeds, sore throat and trouble swallowing.   Eyes: Negative for eye problems and icterus.  Respiratory: Negative for cough, hemoptysis, shortness of breath and wheezing.   Cardiovascular: Negative for chest pain and leg swelling.  Gastrointestinal: Negative for abdominal pain, constipation, diarrhea, nausea and vomiting.  Genitourinary: Negative for bladder incontinence, difficulty urinating, dysuria, frequency and hematuria.    Musculoskeletal: Negative for back pain, gait problem, neck pain and neck stiffness.  Skin: Negative for itching and rash.  Neurological: Negative for dizziness, extremity weakness, gait problem, headaches, light-headedness and seizures.  Hematological: Negative for adenopathy. Does not bruise/bleed easily.  Psychiatric/Behavioral: Negative for confusion, depression and sleep disturbance. The patient is not nervous/anxious.     PHYSICAL EXAMINATION:  There were no vitals taken for this visit.  ECOG PERFORMANCE STATUS: {CHL ONC ECOG [id number]  Physical Exam  Constitutional: Oriented to person, place, and time and well-developed, well-nourished, and in no distress. No distress.  HENT:  Head: Normocephalic and atraumatic.  Mouth/Throat: Oropharynx is clear and moist. No oropharyngeal exudate.  Eyes: Conjunctivae are normal. Right eye exhibits no discharge. Left eye exhibits no discharge. No scleral icterus.  Neck: Normal range of motion. Neck supple.  Cardiovascular: Normal rate, regular rhythm, normal heart sounds and intact distal pulses.   Pulmonary/Chest: Effort normal and breath sounds normal. No respiratory distress. No wheezes. No rales.  Abdominal: Soft. Bowel sounds are normal. Exhibits no distension and no mass. There is no tenderness.  Musculoskeletal: Normal range of motion. Exhibits no edema.  Lymphadenopathy:    No cervical adenopathy.  Neurological: Alert and oriented to person, place, and time. Exhibits normal muscle tone. Gait normal. Coordination normal.  Skin: Skin is warm and dry. No rash noted. Not diaphoretic. No erythema. No pallor.  Psychiatric: Mood, memory and judgment normal.  Vitals reviewed.  LABORATORY DATA: Lab Results  Component Value Date   WBC 1.8 Repeated and verified X2. (LL) 11/17/2023   HGB 13.6 11/17/2023   HCT 40.5 11/17/2023   MCV 98.0 11/17/2023   PLT 250.0 11/17/2023      Chemistry      Component Value Date/Time   NA 139  11/17/2023 0909   K 3.7 11/17/2023 0909   CL 100 11/17/2023 0909   CO2 29 11/17/2023 0909   BUN 8 11/17/2023 0909   CREATININE 0.59 11/17/2023 0909      Component Value Date/Time   CALCIUM 9.8 11/17/2023 0909   ALKPHOS 73 11/17/2023 0909   AST 37 11/17/2023 0909   ALT 47 11/17/2023 0909   BILITOT 0.9 11/17/2023 0909       RADIOGRAPHIC STUDIES: No results found.  ASSESSMENT: This very pleasant 33 year old African-American male referred to the clinic for leukopenia and neutropenia  The patient was seen with Dr. Rosaline Coma today.    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    The differential for neutropenia includes nutritional deficiency, inflammatory disorder, medication side effect, infectious etiology, or congenital condition (such as benign ethnic neutropenia). The patient is *** not taking any medications known to cause neutropenia. Workup for this condition includes viral serologies (HIV, Hep B and Hep C),  vitamin b12/folate, and inflammatory markers with ESR and CRP.     A common cause for low WBC is benign ethnic neutropenia (  BEN). This is a condition whereby individuals of African or Mediterranean descent have lower WBC than the general population. The condition typically has an absolute neutrophil count (ANC) between 1000-1500 with no recurrent infections. The patients with this condition have normal functioning immune systems and have no consequences as a result of their low ANC. BEN is a diagnosis of exclusion, so it would require the full above workup to make.      #Leukopenia,Unclear Etiology  --The patient was seen with Dr. Rosaline Coma --Workup for this condition includes:  --repeat CBC and CMP today --infectious serology testing with Hep B, Hep C. --The patient recently had HIV testing which was negative  --nutritional evaluation with Vitamin b12,  methylmalonic acid, and folate  --inflammatory workup with ESR and CRP  --I will call him with the results early next week once I have everything resulted --If not clear findings, Dr. Rosaline Coma feels this could be secondary to *** --Will determine follow up instructions based on pending labs from today.   The patient voices understanding of current disease status and treatment options and is in agreement with the current care plan.  All questions were answered. The patient knows to call the clinic with any problems, questions or concerns. We can certainly see the patient much sooner if necessary.  Thank you so much for allowing me to participate in the care of Andre Pierce.. I will continue to follow up the patient with you and assist in his care.  I spent {CHL ONC TIME VISIT - ZOXWR:[id number]} counseling the patient face to face. The total time spent in the appointment was {CHL ONC TIME VISIT - BJYNW:[id number]}.  Disclaimer: This note was dictated with voice recognition software. Similar sounding words can inadvertently be transcribed and may not be corrected upon review.   Andre Pierce L Andre Pierce Dec 14, 2023, 12:50 PM

## 2023-12-24 ENCOUNTER — Inpatient Hospital Stay: Attending: Physician Assistant | Admitting: Physician Assistant

## 2023-12-24 ENCOUNTER — Inpatient Hospital Stay
